# Patient Record
Sex: Female | Born: 1935
Health system: Southern US, Community
[De-identification: ages and names within clinical notes are randomized; demographics above are authoritative.]

## PROBLEM LIST (undated history)

## (undated) DIAGNOSIS — K5730 Diverticulosis of large intestine without perforation or abscess without bleeding: Secondary | ICD-10-CM

## (undated) DIAGNOSIS — M1990 Unspecified osteoarthritis, unspecified site: Secondary | ICD-10-CM

## (undated) DIAGNOSIS — I872 Venous insufficiency (chronic) (peripheral): Secondary | ICD-10-CM

## (undated) DIAGNOSIS — K222 Esophageal obstruction: Secondary | ICD-10-CM

## (undated) DIAGNOSIS — E039 Hypothyroidism, unspecified: Secondary | ICD-10-CM

## (undated) DIAGNOSIS — I10 Essential (primary) hypertension: Secondary | ICD-10-CM

## (undated) DIAGNOSIS — Z8679 Personal history of other diseases of the circulatory system: Secondary | ICD-10-CM

## (undated) DIAGNOSIS — K219 Gastro-esophageal reflux disease without esophagitis: Secondary | ICD-10-CM

## (undated) DIAGNOSIS — K449 Diaphragmatic hernia without obstruction or gangrene: Secondary | ICD-10-CM

## (undated) DIAGNOSIS — M519 Unspecified thoracic, thoracolumbar and lumbosacral intervertebral disc disorder: Secondary | ICD-10-CM

## (undated) HISTORY — DX: Diverticulosis of large intestine without perforation or abscess without bleeding: K57.30

## (undated) HISTORY — PX: APPENDECTOMY: SHX54

## (undated) HISTORY — DX: Unspecified thoracic, thoracolumbar and lumbosacral intervertebral disc disorder: M51.9

## (undated) HISTORY — DX: Personal history of other diseases of the circulatory system: Z86.79

## (undated) HISTORY — DX: Hypothyroidism, unspecified: E03.9

## (undated) HISTORY — DX: Esophageal obstruction: K22.2

## (undated) HISTORY — DX: Diaphragmatic hernia without obstruction or gangrene: K44.9

## (undated) HISTORY — DX: Gastro-esophageal reflux disease without esophagitis: K21.9

## (undated) HISTORY — PX: ABDOMINAL HYSTERECTOMY: SHX81

## (undated) HISTORY — DX: Venous insufficiency (chronic) (peripheral): I87.2

## (undated) HISTORY — DX: Unspecified osteoarthritis, unspecified site: M19.90

## (undated) HISTORY — DX: Essential (primary) hypertension: I10

---

## 2000-10-14 ENCOUNTER — Ambulatory Visit (HOSPITAL_COMMUNITY): Admission: RE | Admit: 2000-10-14 | Discharge: 2000-10-14 | Payer: Self-pay | Admitting: *Deleted

## 2000-10-14 ENCOUNTER — Encounter: Payer: Self-pay | Admitting: *Deleted

## 2002-09-05 ENCOUNTER — Encounter: Payer: Self-pay | Admitting: *Deleted

## 2002-09-05 ENCOUNTER — Ambulatory Visit (HOSPITAL_COMMUNITY): Admission: RE | Admit: 2002-09-05 | Discharge: 2002-09-05 | Payer: Self-pay | Admitting: *Deleted

## 2005-11-09 ENCOUNTER — Ambulatory Visit: Payer: Self-pay | Admitting: Gastroenterology

## 2005-11-30 ENCOUNTER — Ambulatory Visit: Payer: Self-pay | Admitting: Gastroenterology

## 2007-02-21 ENCOUNTER — Ambulatory Visit: Payer: Self-pay | Admitting: Gastroenterology

## 2007-02-23 ENCOUNTER — Ambulatory Visit (HOSPITAL_COMMUNITY): Admission: RE | Admit: 2007-02-23 | Discharge: 2007-02-23 | Payer: Self-pay | Admitting: Gastroenterology

## 2007-09-14 DIAGNOSIS — E038 Other specified hypothyroidism: Secondary | ICD-10-CM | POA: Insufficient documentation

## 2007-09-14 DIAGNOSIS — I10 Essential (primary) hypertension: Secondary | ICD-10-CM | POA: Insufficient documentation

## 2007-09-14 DIAGNOSIS — M129 Arthropathy, unspecified: Secondary | ICD-10-CM

## 2007-09-14 DIAGNOSIS — K222 Esophageal obstruction: Secondary | ICD-10-CM

## 2007-09-14 DIAGNOSIS — E039 Hypothyroidism, unspecified: Secondary | ICD-10-CM

## 2007-09-14 DIAGNOSIS — K219 Gastro-esophageal reflux disease without esophagitis: Secondary | ICD-10-CM

## 2007-09-14 DIAGNOSIS — K5730 Diverticulosis of large intestine without perforation or abscess without bleeding: Secondary | ICD-10-CM | POA: Insufficient documentation

## 2007-09-14 DIAGNOSIS — K449 Diaphragmatic hernia without obstruction or gangrene: Secondary | ICD-10-CM | POA: Insufficient documentation

## 2007-09-14 DIAGNOSIS — M5137 Other intervertebral disc degeneration, lumbosacral region: Secondary | ICD-10-CM

## 2007-09-14 HISTORY — DX: Esophageal obstruction: K22.2

## 2009-02-07 IMAGING — RF DG UGI W/ KUB
19 of 24 series · 19 of 24 positions shown · non-contrast
Comparison: none

CLINICAL DATA: 85 old female with two-year history of belching

UPPER GI SERIES W/ HIGH-DENSITY BARIUM AND KUB:
TECHNIQUE: After obtaining a scout radiograph, a double-contrast upper GI
series was performed using both high-density and thin barium.

[Series 1: run · 1 of 1 slices shown (1 of 19)]
[im 1/1]
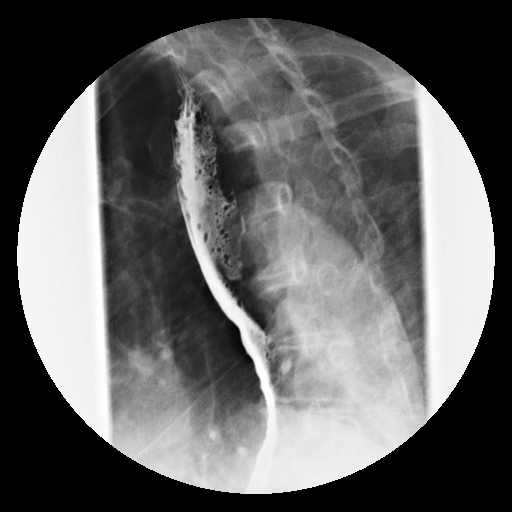

[Series 2: run · 1 of 1 slices shown (2 of 19)]
[im 1/1]
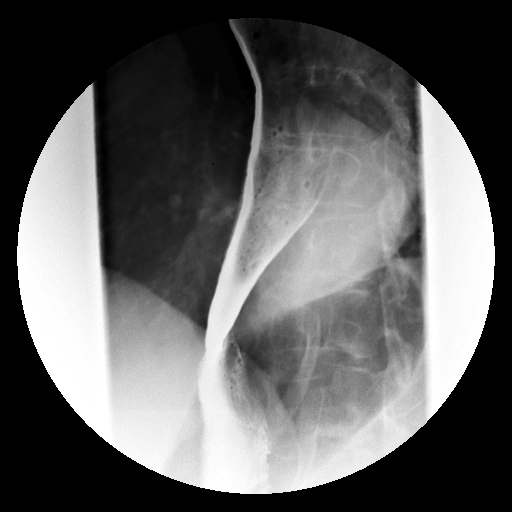

[Series 4: run · 1 of 1 slices shown (3 of 19)]
[im 1/1]
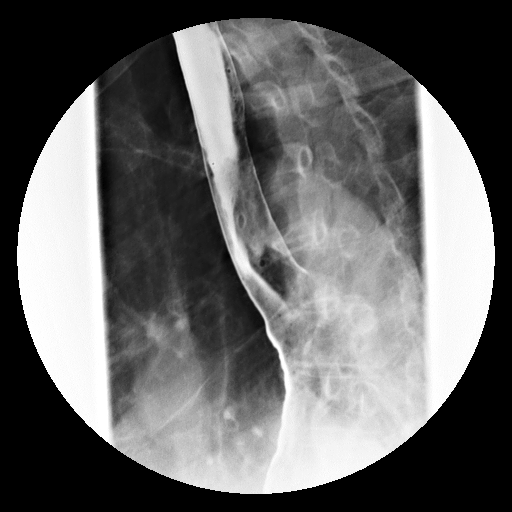

[Series 5: run · 1 of 1 slices shown (4 of 19)]
[im 1/1]
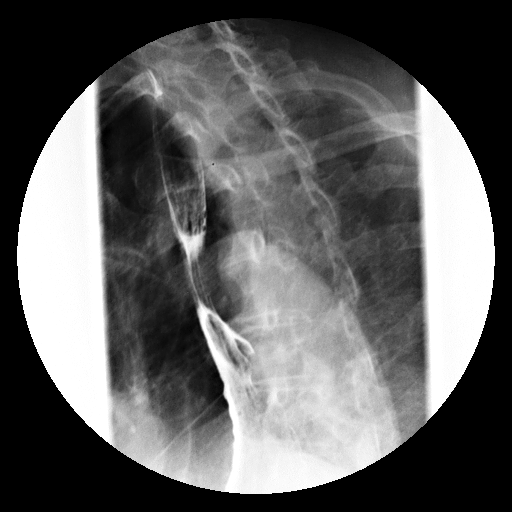

[Series 6: run · 1 of 1 slices shown (5 of 19)]
[im 1/1]
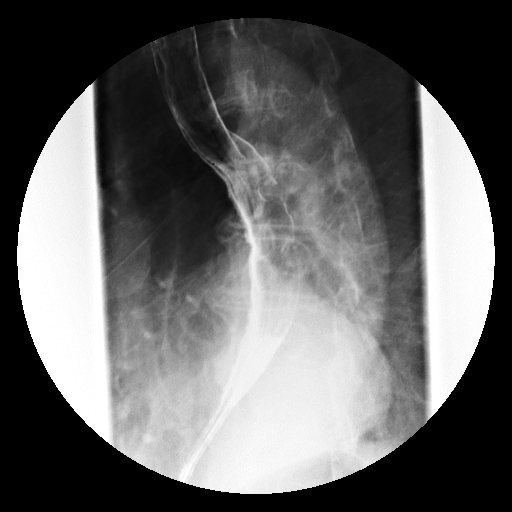

[Series 7: run · 1 of 1 slices shown (6 of 19)]
[im 1/1]
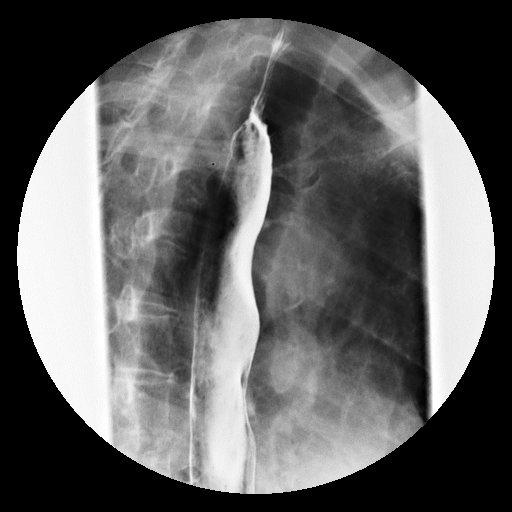

[Series 9: run · 1 of 1 slices shown (7 of 19)]
[im 1/1]
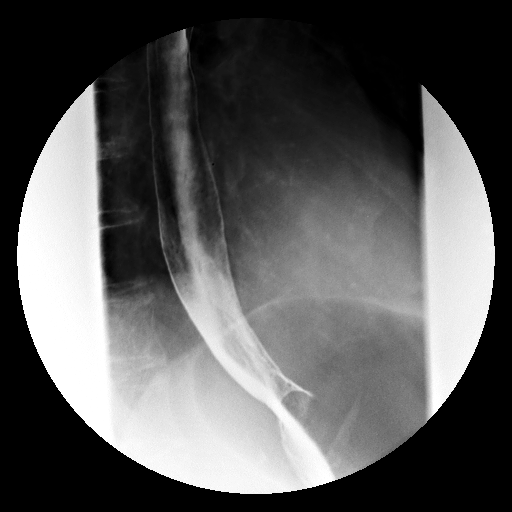

[Series 10: run · 1 of 1 slices shown (8 of 19)]
[im 1/1]
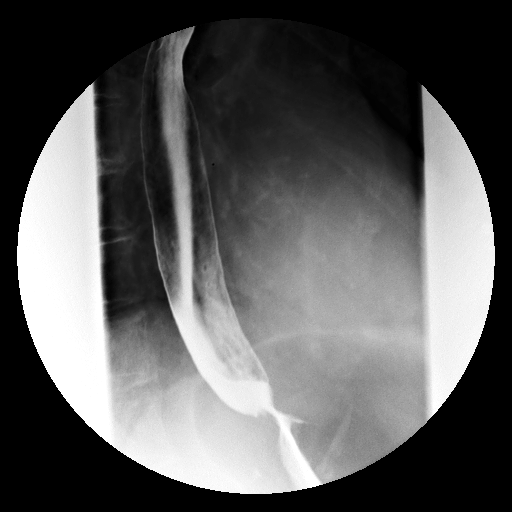

[Series 11: run · 1 of 1 slices shown (9 of 19)]
[im 1/1]
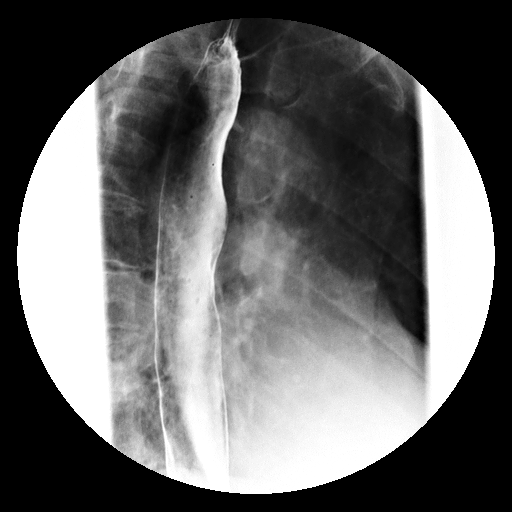

[Series 13: run · 1 of 1 slices shown (10 of 19)]
[im 1/1]
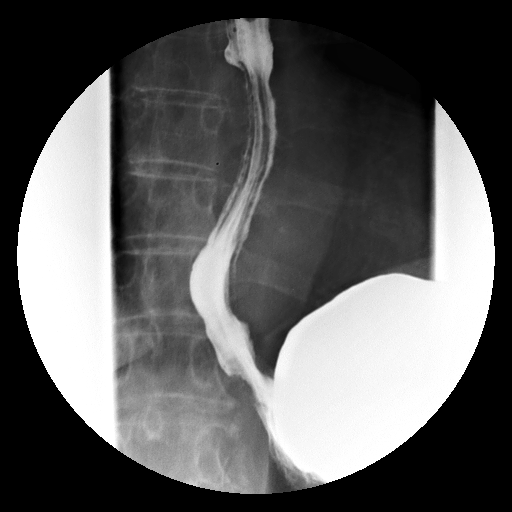

[Series 14: run · 1 of 1 slices shown (11 of 19)]
[im 1/1]
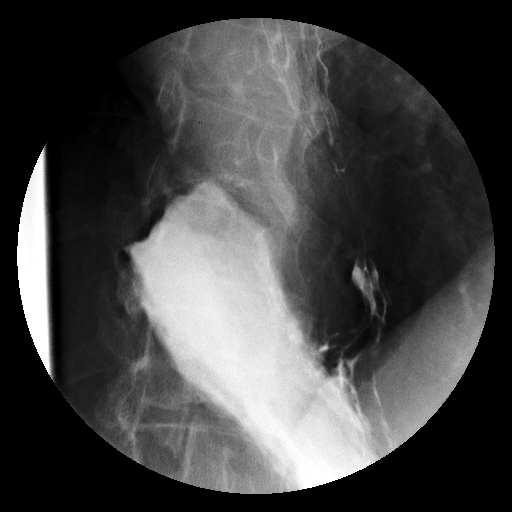

[Series 15: run · 1 of 1 slices shown (12 of 19)]
[im 1/1]
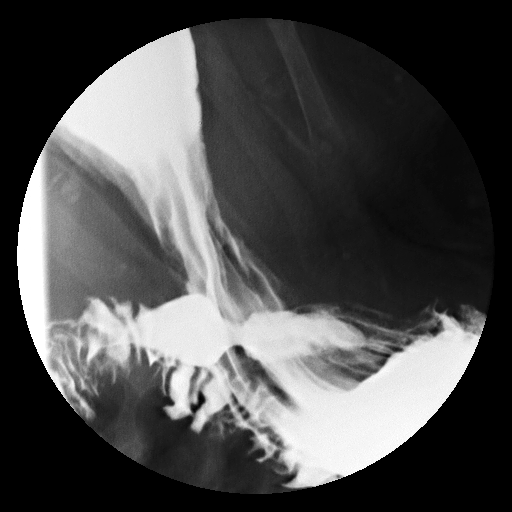

[Series 16: run · 1 of 1 slices shown (13 of 19)]
[im 1/1]
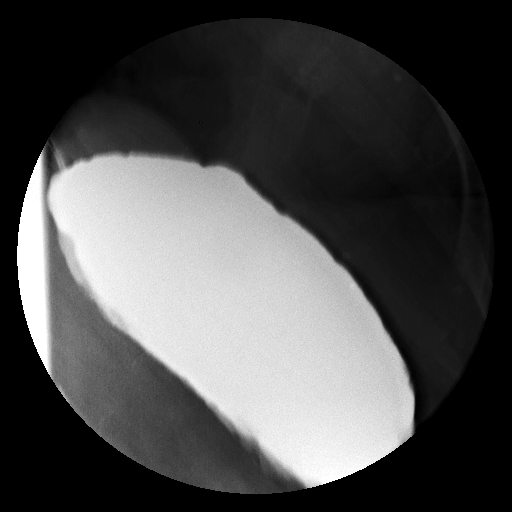

[Series 18: run · 1 of 1 slices shown (14 of 19)]
[im 1/1]
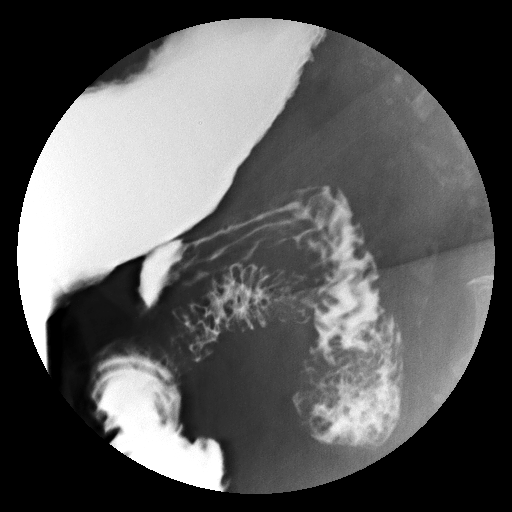

[Series 19: run · 1 of 1 slices shown (15 of 19)]
[im 1/1]
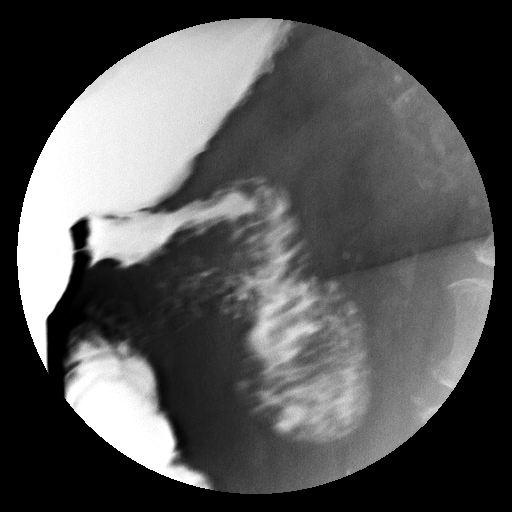

[Series 20: run · 1 of 1 slices shown (16 of 19)]
[im 1/1]
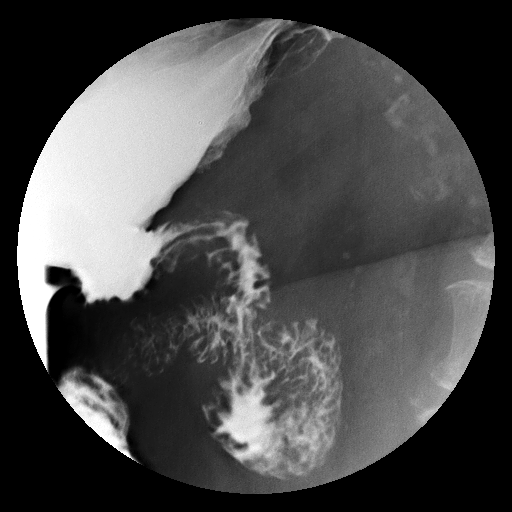

[Series 21: run · 1 of 1 slices shown (17 of 19)]
[im 1/1]
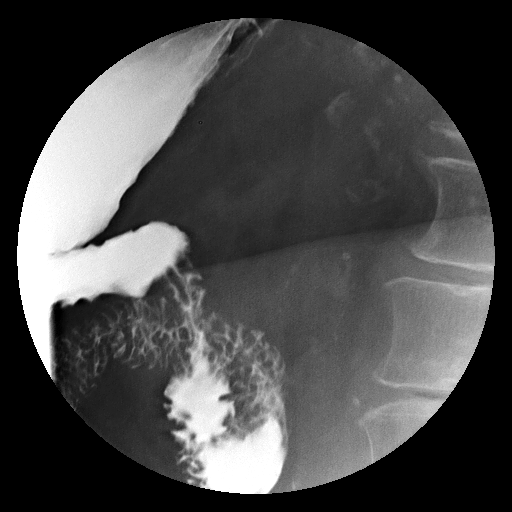

[Series 23: run · 1 of 1 slices shown (18 of 19)]
[im 1/1]
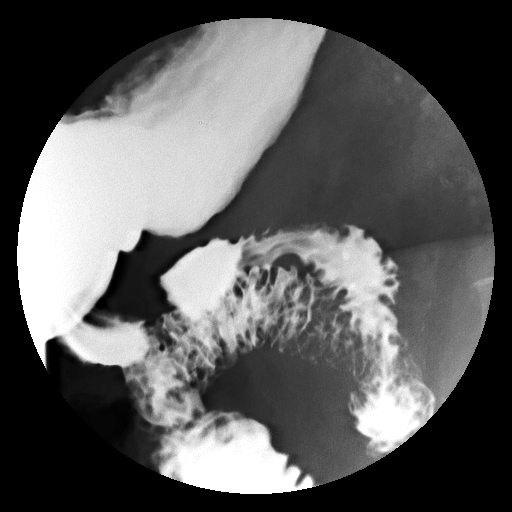

[Series 24: run · 1 of 1 slices shown (19 of 19)]
[im 1/1]
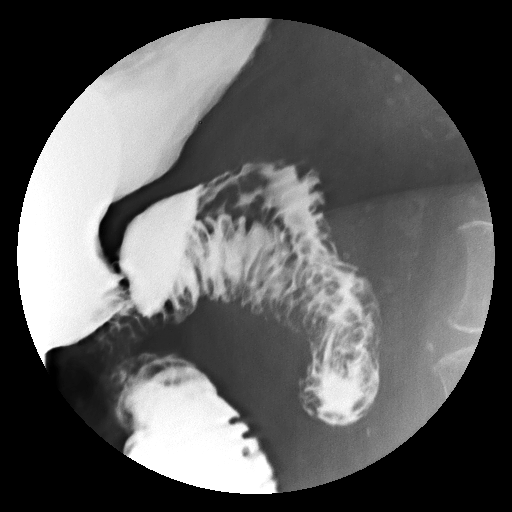

[19 of 24 positions shown; findings below may reference images not displayed]

FINDINGS: Scout film shows no evidence of bowel obstruction.
Esophagus: No evidence of a mucosal irregularity. No evidence of obstruction.
There is a Schatzki ring at the distal esophagus which does not obstruct a 13 mm
barium tablet. There was evidence of reflux during the exam as well small hiatal
hernia. Esophageal motility assessment demonstrated  mild escape of the barium
bolus from a primary stripping wave.

Stomach and duodenum: No evidence of mucosal irregularity, mass, or obstruction
in the stomach or duodenum.
IMPRESSION: 1. Schatzki ring and distal esophagus which does not obstruct a 13 mm barium
tablet.
2. Moderate gastroesophageal reflux.
3. Small hiatal hernia.
4. Mild esophageal dysmotility.
5. No evidence of abnormality in the stomach or duodenum.

## 2009-03-30 ENCOUNTER — Encounter: Payer: Self-pay | Admitting: Internal Medicine

## 2009-03-30 ENCOUNTER — Encounter: Payer: Self-pay | Admitting: Nurse Practitioner

## 2009-03-30 LAB — CONVERTED CEMR LAB
ALT: 17 units/L
AST: 22 units/L
Albumin: 3.9 g/dL
Alkaline Phosphatase: 71 units/L
BUN: 30 mg/dL
CO2: 24 meq/L
Calcium: 9.3 mg/dL
Chloride: 105 meq/L
Creatinine, Ser: 1.21 mg/dL
Glucose, Bld: 97 mg/dL
HCT: 38.7 %
Hemoglobin: 13.3 g/dL
Lymphocytes, automated: 26.3 %
MCV: 100 fL
Monocytes Relative: 10.6 %
Neutrophils Relative %: 60.6 %
Platelets: 204 10*3/uL
Potassium: 4.5 meq/L
RBC: 3.85 M/uL
RDW: 12.8 %
Sodium: 138 meq/L
Total Bilirubin: 0.2 mg/dL
Total Protein: 7.1 g/dL
WBC: 10.5 10*3/uL

## 2009-04-04 ENCOUNTER — Encounter: Payer: Self-pay | Admitting: Nurse Practitioner

## 2009-04-16 ENCOUNTER — Telehealth: Payer: Self-pay | Admitting: Gastroenterology

## 2009-04-18 ENCOUNTER — Ambulatory Visit: Payer: Self-pay | Admitting: Gastroenterology

## 2009-04-18 DIAGNOSIS — R1031 Right lower quadrant pain: Secondary | ICD-10-CM

## 2009-04-18 DIAGNOSIS — R142 Eructation: Secondary | ICD-10-CM

## 2009-04-18 DIAGNOSIS — R141 Gas pain: Secondary | ICD-10-CM | POA: Insufficient documentation

## 2009-04-18 DIAGNOSIS — R143 Flatulence: Secondary | ICD-10-CM

## 2009-05-20 ENCOUNTER — Ambulatory Visit: Payer: Self-pay | Admitting: Internal Medicine

## 2009-05-20 DIAGNOSIS — R32 Unspecified urinary incontinence: Secondary | ICD-10-CM

## 2009-05-20 DIAGNOSIS — D649 Anemia, unspecified: Secondary | ICD-10-CM | POA: Insufficient documentation

## 2010-06-26 ENCOUNTER — Encounter: Payer: Self-pay | Admitting: Internal Medicine

## 2010-06-26 ENCOUNTER — Encounter: Payer: Self-pay | Admitting: Endocrinology

## 2010-07-23 NOTE — Letter (Signed)
Summary: [facility]  [facility]   Imported By: Lennie Odor 07/08/2010 14:18:53  _____________________________________________________________________  External Attachment:    Type:   Image     Comment:   External Document

## 2010-07-23 NOTE — Letter (Signed)
Summary: [facility]   Imported By: Sherian Rein 07/08/2010 11:07:41  _____________________________________________________________________  External Attachment:    Type:   Image     Comment:   External Document

## 2010-11-03 NOTE — Assessment & Plan Note (Signed)
[facility]                         GASTROENTEROLOGY OFFICE NOTE   NAME:Phillips, Chelsea BOLTE                  MRN:          [id number]  DATE:02/21/2007                            DOB:          [birth year]    Chelsea Phillips no longer is having right lower quadrant pain as per  clinic visit in May of 2007 which led to endoscopy and colonoscopy.  She  now says that ever since she had endoscopy on November 30, 2005 that she has  had constant belching and burping, not relieved by antacids or Nexium.  At the time of her endoscopy she had a 5 cm. hiatal hernia and underwent  esophageal dilation.  She still complains of intermittent solid food  dysphagia.  She denies abdominal pain, excessive gas production,  bloating or distention.   She is on Benefiber, Synthroid and Benicar.   She has had no anorexia, weight loss, other systemic complaints.  She  previously saw Dr. Charm Barges and was not happy with his care additionally.   ADDITIONAL MEDICAL PROBLEMS:  In this patient insomnia, hypothyroidism  and chronic fatigue.   She weighs 172 pounds, blood pressure is 118/70 and pulse was 80 and  regular.  I could not appreciate stigmata of chronic liver disease.  CHEST:  Clear.  She was in a regular rhythm without significant murmurs, gallops or  rubs.  ABDOMEN:  Did show small umbilical hernia but otherwise was unremarkable  without distention, organomegaly, masses or tenderness.  Bowel sounds  were normal and there was no succussion splash noted in the epigastric  area.   ASSESSMENT:  I think Chelsea Phillips has functional gastrointestinal  problems and I doubt she has any serious underlying gastrointestinal  pathology.  Most of her belching and burping is obviously secondary to  airophasia.   RECOMMENDATIONS:  1. Checked barium swallow and upper GI series to be sure there is no      associated upper GI motility disorder.  2. Patient information concerning  airophasia, belching and excessive      gas.  3. Continue other medications per Dr. Jeanie Sewer.   Patient did have CT scan of the abdomen and pelvis without contrast in  May of 2007 which was unremarkable except for diverticulosis.     Chelsea Rea. Jarold Motto, MD, Caleen Essex, FAGA  Electronically Signed    DRP/MedQ  DD: 02/21/2007  DT: 02/21/2007  Job #: [id number]   cc:   Myra Rude, MD

## 2010-11-06 NOTE — Op Note (Signed)
   NAME:  Chelsea Phillips, Chelsea Phillips                     ACCOUNT NO.:  [id number]   MEDICAL RECORD NO.:  [id number]                   PATIENT TYPE:  OUT   LOCATION:  XRAY                                 FACILITY:  [facility]   PHYSICIAN:  Ricky D. Gasper Sells, M.D.             DATE OF BIRTH:  06/25/1921   DATE OF PROCEDURE:  09/05/2002  DATE OF DISCHARGE:                                 OPERATIVE REPORT   PROCEDURE:  Left SI joint block using 22-gauge needle under fluoroscopic  control.   SURGEON:  Ricky D. Gasper Sells, M.D.   COMPLICATIONS:  Nil.   SUMMARY:  This is a 77 year old female with underlying spinal stenosis and  bilateral SI joint dysfunction.  Two years ago she was injected and she had  significant reduction in her pain and actually was doing quite well until a  few weeks ago when she was cleaning her yard up and bent over to pick some  stuff up and basically stirred up her underlying SI joint dysfunction.  Clinically, she had the typical picture with tenderness over the joints,  negative straight leg raises, negative hip examination, and a positive SI  joint provocative test.   She was brought to the x-ray department where buttocks were prepped with  Betadine solution and the 22-gauge needle was inserted down to the junction  of the left SI joint with the pelvis in the AP projection.  Poking this  joint recreated her discomfort.   The joint was then blocked with 2 mL of 0.5% bupivacaine without  preservatives or epinephrine, and 1 mL of Depo- Medrol, 80 mg of  methylprednisolone.  Was locked in 0.5 mL aliquots with aspiration before  each injection.   This significantly decreased her pain.  Control x-rays were done and she  left the x-ray department in good condition.                                               Ricky D. Gasper Sells, M.D.    RDH/MEDQ  D:  09/06/2002  T:  09/06/2002  Job:  [id number]

## 2010-11-06 NOTE — Op Note (Signed)
   NAME:  Chelsea Phillips, Chelsea Phillips                     ACCOUNT NO.:  [id number]   MEDICAL RECORD NO.:  [id number]                   PATIENT TYPE:  OUT   LOCATION:  XRAY                                 FACILITY:  [facility]   PHYSICIAN:  Ricky D. Gasper Sells, M.D.             DATE OF BIRTH:  1921-09-07   DATE OF PROCEDURE:  09/05/2002  DATE OF DISCHARGE:                                 OPERATIVE REPORT   PROCEDURE:  Right SI joint block using 22-gauge needle under fluoroscopic  control, 2 mL of 0.5% bupivacaine and 1 mL of Depo-Medrol, 80 mg of  methylprednisolone.   SURGEON:  Ricky D. Gasper Sells, M.D.   COMPLICATIONS:  Nil.   SUMMARY:  This is a 77 year old female with injection responsive SI joint  dysfunction in addition to her underlying spinal stenosis.  I injected her a  couple years ago with a good response.  This held up until last week when  she injured her back cleaning her yard up.  She had clinically typical SI  joint dysfunction so she was taken to the x-ray department where her  __________ was prepped with Betadine solution and a 22-gauge needle was  inserted down to the junction of the SI joint with the pelvis.  Poking this  joint on the right side recreated her discomfort.  The joint was then  blocked with 2 mL of 0.5% bupivacaine and 1 mL of Depo-Medrol in 0.5 mL  aliquots for __________ injection.  This significantly decreased her right-  sided discomfort.                                               Ricky D. Gasper Sells, M.D.    RDH/MEDQ  D:  09/06/2002  T:  09/06/2002  Job:  [id number]

## 2011-06-30 ENCOUNTER — Other Ambulatory Visit: Payer: Self-pay | Admitting: Gastroenterology

## 2011-06-30 NOTE — Telephone Encounter (Signed)
Patient has not been taking the Nexium and has not been seen in office since 2010, she will need ov before she can have refill, daughter says ok.

## 2011-08-09 ENCOUNTER — Encounter: Payer: Self-pay | Admitting: *Deleted

## 2011-08-12 ENCOUNTER — Encounter: Payer: Self-pay | Admitting: Gastroenterology

## 2011-08-12 ENCOUNTER — Other Ambulatory Visit (INDEPENDENT_AMBULATORY_CARE_PROVIDER_SITE_OTHER): Payer: Medicare Other

## 2011-08-12 ENCOUNTER — Ambulatory Visit (INDEPENDENT_AMBULATORY_CARE_PROVIDER_SITE_OTHER): Payer: Medicare Other | Admitting: Gastroenterology

## 2011-08-12 DIAGNOSIS — E539 Vitamin B deficiency, unspecified: Secondary | ICD-10-CM

## 2011-08-12 DIAGNOSIS — E538 Deficiency of other specified B group vitamins: Secondary | ICD-10-CM

## 2011-08-12 DIAGNOSIS — K5730 Diverticulosis of large intestine without perforation or abscess without bleeding: Secondary | ICD-10-CM

## 2011-08-12 DIAGNOSIS — R209 Unspecified disturbances of skin sensation: Secondary | ICD-10-CM

## 2011-08-12 DIAGNOSIS — K219 Gastro-esophageal reflux disease without esophagitis: Secondary | ICD-10-CM

## 2011-08-12 DIAGNOSIS — E039 Hypothyroidism, unspecified: Secondary | ICD-10-CM | POA: Insufficient documentation

## 2011-08-12 DIAGNOSIS — Z79899 Other long term (current) drug therapy: Secondary | ICD-10-CM

## 2011-08-12 LAB — TSH: TSH: 3.26 u[IU]/mL (ref 0.35–5.50)

## 2011-08-12 LAB — BASIC METABOLIC PANEL
BUN: 22 mg/dL (ref 6–23)
Calcium: 9.2 mg/dL (ref 8.4–10.5)
Chloride: 104 mEq/L (ref 96–112)
Creatinine, Ser: 1.2 mg/dL (ref 0.4–1.2)
GFR: 46.4 mL/min — ABNORMAL LOW (ref >60.00)

## 2011-08-12 LAB — CBC WITH DIFFERENTIAL/PLATELET
Eosinophils Relative: 2.4 % (ref 0.0–5.0)
HCT: 42 % (ref 36.0–46.0)
Hemoglobin: 14.1 g/dL (ref 12.0–15.0)
Lymphs Abs: 3.1 10*3/uL (ref 0.7–4.0)
MCV: 97.5 fl (ref 78.0–100.0)
Monocytes Absolute: 0.9 10*3/uL (ref 0.1–1.0)
Monocytes Relative: 10.2 % (ref 3.0–12.0)
Neutro Abs: 4.5 10*3/uL (ref 1.4–7.7)
WBC: 8.7 10*3/uL (ref 4.5–10.5)

## 2011-08-12 LAB — HEPATIC FUNCTION PANEL
Albumin: 4.2 g/dL (ref 3.5–5.2)
Alkaline Phosphatase: 59 U/L (ref 39–117)
Total Bilirubin: 0.5 mg/dL (ref 0.3–1.2)

## 2011-08-12 LAB — HIGH SENSITIVITY CRP: CRP, High Sensitivity: 5.21 mg/L — ABNORMAL HIGH (ref 0.000–5.000)

## 2011-08-12 LAB — MAGNESIUM: Magnesium: 2.1 mg/dL (ref 1.5–2.5)

## 2011-08-12 LAB — IBC PANEL
Iron: 93 ug/dL (ref 42–145)
Saturation Ratios: 28.8 % (ref 20.0–50.0)
Transferrin: 230.4 mg/dL (ref 212.0–360.0)

## 2011-08-12 MED ORDER — PEG-KCL-NACL-NASULF-NA ASC-C 100 G PO SOLR: 1.0000 | Freq: Once | ORAL | Status: DC

## 2011-08-12 MED ORDER — LIDOCAINE 5 % EX PTCH: 1.0000 | MEDICATED_PATCH | CUTANEOUS | Status: AC

## 2011-08-12 MED ORDER — ESOMEPRAZOLE MAGNESIUM 40 MG PO CPDR: 40.0000 mg | DELAYED_RELEASE_CAPSULE | Freq: Every day | ORAL | Status: DC

## 2011-08-12 NOTE — Patient Instructions (Signed)
Your procedure has been scheduled for 08/16/2011, please follow the seperate instructions.  Your prescription(s) have been sent to you pharmacy.  Please go to the basement today for your labs.  Apply the Lidoderm patch to your foot, make sure to change patch every 12 hours and use only as needed.

## 2011-08-12 NOTE — Progress Notes (Signed)
History of Present Illness:  This is a delightful 78 year old Caucasian female with chronic GERD who ran out of her Nexium,and has had recurrent regurgitation, burning substernal chest pain, and intermittent solid food dysphagia aggressively over the last 3-4 months.. Last endoscopy and dilation was several years ago. There is no history of Raynaud's phenomenon, but the patient does have severe burning of her feet keeping her up at night. Her only medication is Synthroid 50 mcg a day. She has lumbar disc disease and chronic low back pain, but has not all analgesics. Review of her chart shows chronic right lower quadrant pain for many years with last colonoscopy performed in 2007. She has mild constipation but denies melena or hematochezia, anorexia, weight loss, systemic or hepatobiliary complaints. She does have known rather severe diverticulosis. The patient has not had an episode of diverticulitis in several years. She recently had cardiac evaluation by Dr. Susa Griffins and was apparently told that she had" reflux". Patient follows a regular diet and denies any specific food intolerances. She gives no history of anemia or other metabolic abnormalities but apparently has not had labs in several years.. Primary care physician is Dr. Feliciana Rossetti. Her daughter was present throughout the interview and exam.  I have reviewed this patient's present history, medical and surgical past history, allergies and medications.     ROS: The remainder of the 10 point ROS is negative  Allergies  Allergen Reactions  . Hydrocodone Rash   No outpatient prescriptions prior to visit.   Past Medical History  Diagnosis Date  . Hiatal hernia   . Esophageal reflux   . Stricture and stenosis of esophagus   . Diverticulosis of colon (without mention of hemorrhage)   . Hypertension   . Arthritis   . Hypothyroidism   . Lumbar disc disease    Past Surgical History  Procedure Date  . Abdominal hysterectomy   .  Appendectomy    History   Social History  . Marital Status: Widowed    Spouse Name: N/A    Number of Children: 3  . Years of Education: N/A   Occupational History  . Retired    Social History Main Topics  . Smoking status: Never Smoker   . Smokeless tobacco: Never Used  . Alcohol Use: No  . Drug Use: No  . Sexually Active: None   Other Topics Concern  . None   Social History Narrative  . None   Family History  Problem Relation Age of Onset  . Heart disease Mother         Physical Exam: General well developed well nourished patient in no acute distress, appearing her stated age Eyes PERRLA, no icterus, fundoscopic exam per opthamologist Skin no lesions noted Neck supple, no adenopathy, no thyroid enlargement, no tenderness Chest clear to percussion and auscultation Heart no significant murmurs, gallops or rubs noted Abdomen no hepatosplenomegaly masses or tenderness, BS normal. Umbilical hernia noted which is easily reduced.  Extremities no acute joint lesions, edema, phlebitis or evidence of cellulitis. Peripheral pulses appear intact. Neurologic patient oriented x 3, cranial nerves intact, no focal neurologic deficits noted. Psychological mental status normal and normal affect.  Assessment and plan: Chronic acid reflux with probable recurrent peptic stricture of her esophagus area and I've scheduled for endoscopy and probable Maloney dilatation. We have renewed her Nexium 40 mg 30 minutes before breakfast. Followup colonoscopy also has been scheduled because of her chronic abdominal pain change in bowel habits. Because of her age  we'll perform these procedures with propofol sedation a nurse anesthesia attendence. I have suggested a high fiber diet with daily Metamucil and liberal by mouth fluids. She seemed to have a peripheral neuropathy in her feet of unexplained etiology, and various labs have been ordered including B12 level to see if there is some metabolic cause  for this symptom. Encounter Diagnoses  Name Primary?  . Esophageal reflux   . Diverticulosis of colon (without mention of hemorrhage)

## 2011-08-13 ENCOUNTER — Telehealth: Payer: Self-pay | Admitting: Gastroenterology

## 2011-08-13 LAB — CELIAC PANEL 10: Tissue Transglut Ab: 11.1 U/mL (ref <20)

## 2011-08-13 NOTE — Telephone Encounter (Signed)
Spoke with pt to explain Dr Jarold Motto suggests she at least needs EGD with dilation. Pt states she cannot tolerate the colon prep and she doesn't think she needs the EGD either. Explained she may have to pay a cancellation and she stated understanding. Charge fee? Thanks.

## 2011-08-13 NOTE — Telephone Encounter (Signed)
She needs endoscopy and dilation. We can probably forego colonoscopy.

## 2011-08-13 NOTE — Telephone Encounter (Signed)
No charge per Dr Jarold Motto. Thanks [place]!

## 2011-08-13 NOTE — Telephone Encounter (Signed)
no

## 2011-08-16 ENCOUNTER — Encounter: Payer: Medicare Other | Admitting: Gastroenterology

## 2012-06-06 ENCOUNTER — Other Ambulatory Visit (HOSPITAL_COMMUNITY): Payer: Self-pay | Admitting: Cardiovascular Disease

## 2012-06-06 DIAGNOSIS — I495 Sick sinus syndrome: Secondary | ICD-10-CM

## 2012-06-06 DIAGNOSIS — I480 Paroxysmal atrial fibrillation: Secondary | ICD-10-CM

## 2012-06-19 ENCOUNTER — Ambulatory Visit (HOSPITAL_COMMUNITY)
Admission: RE | Admit: 2012-06-19 | Discharge: 2012-06-19 | Disposition: A | Discharge status: Home / Self Care | Payer: Medicare Other | Source: Ambulatory Visit | Attending: Cardiovascular Disease | Admitting: Cardiovascular Disease

## 2012-06-19 DIAGNOSIS — I480 Paroxysmal atrial fibrillation: Secondary | ICD-10-CM

## 2012-06-19 DIAGNOSIS — I495 Sick sinus syndrome: Secondary | ICD-10-CM

## 2012-06-19 DIAGNOSIS — I4891 Unspecified atrial fibrillation: Secondary | ICD-10-CM | POA: Insufficient documentation

## 2012-06-19 DIAGNOSIS — I080 Rheumatic disorders of both mitral and aortic valves: Secondary | ICD-10-CM | POA: Insufficient documentation

## 2012-06-19 HISTORY — PX: TRANSTHORACIC ECHOCARDIOGRAM: SHX275

## 2012-06-19 NOTE — Progress Notes (Signed)
2D Echo Performed 06/19/2012    Laurieanne Galloway, RCS  

## 2012-10-06 ENCOUNTER — Encounter: Payer: Self-pay | Admitting: Pharmacist Clinician (PhC)/ Clinical Pharmacy Specialist

## 2012-10-06 DIAGNOSIS — Z7901 Long term (current) use of anticoagulants: Secondary | ICD-10-CM

## 2012-10-06 DIAGNOSIS — I4891 Unspecified atrial fibrillation: Secondary | ICD-10-CM | POA: Insufficient documentation

## 2012-11-20 LAB — PROTIME-INR: INR: 3.4 — AB (ref <=1.1)

## 2012-11-21 ENCOUNTER — Ambulatory Visit (INDEPENDENT_AMBULATORY_CARE_PROVIDER_SITE_OTHER): Payer: Self-pay | Admitting: Pharmacist Clinician (PhC)/ Clinical Pharmacy Specialist

## 2012-11-21 DIAGNOSIS — I4891 Unspecified atrial fibrillation: Secondary | ICD-10-CM

## 2012-11-21 DIAGNOSIS — Z7901 Long term (current) use of anticoagulants: Secondary | ICD-10-CM

## 2012-12-12 LAB — PROTIME-INR: INR: 2.7 — AB (ref <=1.1)

## 2012-12-13 ENCOUNTER — Ambulatory Visit (INDEPENDENT_AMBULATORY_CARE_PROVIDER_SITE_OTHER): Payer: Medicare Other | Admitting: Pharmacist Clinician (PhC)/ Clinical Pharmacy Specialist

## 2012-12-13 DIAGNOSIS — Z7901 Long term (current) use of anticoagulants: Secondary | ICD-10-CM

## 2012-12-13 DIAGNOSIS — I4891 Unspecified atrial fibrillation: Secondary | ICD-10-CM

## 2013-01-11 ENCOUNTER — Ambulatory Visit (INDEPENDENT_AMBULATORY_CARE_PROVIDER_SITE_OTHER): Payer: Self-pay | Admitting: Pharmacist Clinician (PhC)/ Clinical Pharmacy Specialist

## 2013-01-11 DIAGNOSIS — Z7901 Long term (current) use of anticoagulants: Secondary | ICD-10-CM

## 2013-01-11 DIAGNOSIS — I4891 Unspecified atrial fibrillation: Secondary | ICD-10-CM

## 2013-01-31 ENCOUNTER — Ambulatory Visit: Payer: Self-pay | Admitting: Pharmacist Clinician (PhC)/ Clinical Pharmacy Specialist

## 2013-01-31 DIAGNOSIS — I4891 Unspecified atrial fibrillation: Secondary | ICD-10-CM

## 2013-01-31 DIAGNOSIS — Z7901 Long term (current) use of anticoagulants: Secondary | ICD-10-CM

## 2013-02-09 ENCOUNTER — Ambulatory Visit (INDEPENDENT_AMBULATORY_CARE_PROVIDER_SITE_OTHER): Payer: Self-pay | Admitting: Pharmacist Clinician (PhC)/ Clinical Pharmacy Specialist

## 2013-02-09 DIAGNOSIS — Z7901 Long term (current) use of anticoagulants: Secondary | ICD-10-CM

## 2013-02-09 DIAGNOSIS — I4891 Unspecified atrial fibrillation: Secondary | ICD-10-CM

## 2013-02-26 ENCOUNTER — Ambulatory Visit (INDEPENDENT_AMBULATORY_CARE_PROVIDER_SITE_OTHER): Payer: Self-pay | Admitting: Pharmacist Clinician (PhC)/ Clinical Pharmacy Specialist

## 2013-02-26 DIAGNOSIS — I4891 Unspecified atrial fibrillation: Secondary | ICD-10-CM

## 2013-02-26 DIAGNOSIS — Z7901 Long term (current) use of anticoagulants: Secondary | ICD-10-CM

## 2013-03-07 ENCOUNTER — Telehealth: Payer: Self-pay | Admitting: Cardiovascular Disease

## 2013-03-07 NOTE — Telephone Encounter (Signed)
Chart on Dr. Kandis Cocking cart w/ note.

## 2013-03-07 NOTE — Telephone Encounter (Signed)
Would like Dr Alanda Amass recommend her a new doctor since he is retiring please.

## 2013-03-09 ENCOUNTER — Telehealth: Payer: Self-pay | Admitting: Cardiovascular Disease

## 2013-03-09 MED ORDER — WARFARIN SODIUM 4 MG PO TABS: ORAL_TABLET | ORAL | Status: DC

## 2013-03-09 NOTE — Telephone Encounter (Signed)
Need refill on her Warfarin 4 mg #90.

## 2013-03-09 NOTE — Telephone Encounter (Signed)
Message forwarded to K. Alvstad, PharmD.  

## 2013-03-16 ENCOUNTER — Ambulatory Visit (INDEPENDENT_AMBULATORY_CARE_PROVIDER_SITE_OTHER): Payer: Medicare Other | Admitting: Pharmacist Clinician (PhC)/ Clinical Pharmacy Specialist

## 2013-03-16 DIAGNOSIS — I4891 Unspecified atrial fibrillation: Secondary | ICD-10-CM

## 2013-03-16 DIAGNOSIS — Z7901 Long term (current) use of anticoagulants: Secondary | ICD-10-CM

## 2013-03-27 ENCOUNTER — Telehealth: Payer: Self-pay | Admitting: Pharmacist Clinician (PhC)/ Clinical Pharmacy Specialist

## 2013-03-27 NOTE — Telephone Encounter (Signed)
Pt to have dental extraction Tuesday.  Per Dr. Alanda Amass note, he hopes to get her off warfarin soon, will have hin review if ok to d/c.  In the meantime, I advised that she hold her dose x 2 days prior to dental extraction.  Pt voiced understanding.

## 2013-03-27 NOTE — Telephone Encounter (Signed)
Pt to have tooth extracted on Tuesday.  How long does she need to be off Warfarin?

## 2013-04-03 ENCOUNTER — Encounter: Payer: Self-pay | Admitting: Internal Medicine

## 2013-04-03 ENCOUNTER — Encounter: Payer: Self-pay | Admitting: *Deleted

## 2013-04-04 ENCOUNTER — Ambulatory Visit (INDEPENDENT_AMBULATORY_CARE_PROVIDER_SITE_OTHER): Payer: Medicare Other | Admitting: Internal Medicine

## 2013-04-04 ENCOUNTER — Encounter: Payer: Self-pay | Admitting: Internal Medicine

## 2013-04-04 VITALS — BP 128/80 | HR 63 | Ht 61.0 in | Wt 178.9 lb

## 2013-04-04 DIAGNOSIS — I4891 Unspecified atrial fibrillation: Secondary | ICD-10-CM

## 2013-04-04 DIAGNOSIS — I10 Essential (primary) hypertension: Secondary | ICD-10-CM

## 2013-04-04 DIAGNOSIS — Z7901 Long term (current) use of anticoagulants: Secondary | ICD-10-CM

## 2013-04-04 NOTE — Progress Notes (Signed)
OFFICE NOTE  Chief Complaint:  Routine follow-up  Primary Care Physician: Blane Ohara, MD  HPI:  Chelsea Phillips is a pleasant 79 year old female who still lives independently. She's been followed in the past by Dr. Alanda Amass prior to his retirement for atrial fibrillation. Her first episode of atrial fibrillation was 7 years ago and she had very little A. fib for which he was symptomatic until she was hospitalized in July of 2013 with A. fib and RVR. She was placed on Cardizem and Lopressor and her thyroid medicine was increased and she was noted to be hypothyroid. She spontaneously converted back to sinus and has been on warfarin. Recently she saw Dr. Alanda Amass and there was some question about possibly coming off of warfarin or switching her to an alternative NOAC.  She wore a monitor in April of this year and did have some atrial fibrillation detected. She denies any chest pain or worsening shortness of breath. She does have a history of some mild hypertension currently only on losartan and metoprolol.   PMHx:  Past Medical History  Diagnosis Date  . Hiatal hernia   . Esophageal reflux   . Stricture and stenosis of esophagus   . Diverticulosis of colon (without mention of hemorrhage)   . Hypertension   . Arthritis   . Hypothyroidism   . Lumbar disc disease   . Venous insufficiency   . History of atrial fibrillation     with RVR (hopsitalized 12/2011) - converted spontanesouly    Past Surgical History  Procedure Laterality Date  . Abdominal hysterectomy    . Appendectomy    . Transthoracic echocardiogram  06/19/2012    EF 55-60%, grade 1 diastolic dysfunction; mild AV regurg; mild MR, calcified MV annulus; mild AI; normal PA pressure     FAMHx:  Family History  Problem Relation Age of Onset  . Heart disease Mother   . Leukemia Sister     SOCHx:   reports that she has never smoked. She has never used smokeless tobacco. She reports that she does not drink alcohol  or use illicit drugs.  ALLERGIES:  No Known Allergies  ROS: A comprehensive review of systems was negative except for: Respiratory: positive for dyspnea on exertion Musculoskeletal: positive for left ankle fracture.eversion  HOME MEDS: Current Outpatient Prescriptions  Medication Sig Dispense Refill  . esomeprazole (NEXIUM) 40 MG capsule Take 40 mg by mouth daily before breakfast.      . HYDROcodone-acetaminophen (NORCO/VICODIN) 5-325 MG per tablet Take 1 tablet by mouth every 6 (six) hours as needed for pain.      Marland Kitchen levothyroxine (SYNTHROID, LEVOTHROID) 75 MCG tablet Take 75 mcg by mouth daily before breakfast.      . losartan (COZAAR) 50 MG tablet Take 50 mg by mouth daily.      . metoprolol tartrate (LOPRESSOR) 25 MG tablet Take 25 mg by mouth 2 (two) times daily.      . mometasone (NASONEX) 50 MCG/ACT nasal spray Place 2 sprays into the nose daily.      Marland Kitchen warfarin (COUMADIN) 4 MG tablet Take 1 tablet by mouth once daily or as directed  90 tablet  1   No current facility-administered medications for this visit.    LABS/IMAGING: No results found for this or any previous visit (from the past 48 hour(s)). No results found.  VITALS: BP 128/80  Pulse 63  Ht 5\' 1"  (1.549 m)  Wt 178 lb 14.4 oz (81.149 kg)  BMI 33.82 kg/m2  EXAM: General appearance: alert and no distress Lungs: clear to auscultation bilaterally Heart: regular rate and rhythm, S1, S2 normal, no murmur, click, rub or gallop Abdomen: soft, non-tender; bowel sounds normal; no masses,  no organomegaly and obese Pulses: 2+ and symmetric  EKG: Normal sinus rhythm at 63  ASSESSMENT: 1. Paroxysmal atrial fibrillation 2. Hypertension-controlled 3. Chronically anticoagulated on warfarin  PLAN: 1.   Chelsea Phillips is interested in either coming off of warfarin or switching to a NOAC. I'm hesitant to take her off of warfarin based on her CHADS2VASC score of 4 and CHADS2 score of 2 - this puts her risks of stroke at  5-6% annually without therapy. She has not had any bleeding complications or significant falls are concerning for continuing to use anticoagulation. She will wish to consider whether she wants to stay on warfarin or change to possibly Eliquis.  I will discuss this further with our pharmacist to see if she has an opinion on the subject. Plan to see her back in 6 months or sooner if necessary.  Chrystie Nose, MD, [facility] Attending Cardiologist [facility]  Cloteal Isaacson C 04/04/2013, 12:33 PM

## 2013-04-04 NOTE — Patient Instructions (Signed)
Your physician wants you to follow-up in:  6 months. You will receive a reminder letter in the mail two months in advance. If you don't receive a letter, please call our office to schedule the follow-up appointment.   

## 2013-04-05 ENCOUNTER — Encounter: Payer: Self-pay | Admitting: Internal Medicine

## 2013-04-12 ENCOUNTER — Ambulatory Visit: Payer: Self-pay | Admitting: Pharmacist Clinician (PhC)/ Clinical Pharmacy Specialist

## 2013-04-12 DIAGNOSIS — I4891 Unspecified atrial fibrillation: Secondary | ICD-10-CM

## 2013-04-12 DIAGNOSIS — Z7901 Long term (current) use of anticoagulants: Secondary | ICD-10-CM

## 2013-04-12 MED ORDER — APIXABAN 5 MG PO TABS: 5.0000 mg | ORAL_TABLET | Freq: Two times a day (BID) | ORAL | Status: DC

## 2013-04-26 ENCOUNTER — Telehealth: Payer: Self-pay

## 2013-04-26 NOTE — Telephone Encounter (Signed)
Please advise for Levothryoxine - Dr Alanda Amass prescribed this in the past.

## 2013-04-27 NOTE — Telephone Encounter (Signed)
Rx refused, defer to PCP. Pharmacy notified electronically.

## 2013-04-27 NOTE — Telephone Encounter (Signed)
Defer to PCP to order.  Dr. Rennis Golden

## 2013-05-08 ENCOUNTER — Telehealth: Payer: Self-pay | Admitting: Pharmacist Clinician (PhC)/ Clinical Pharmacy Specialist

## 2013-05-08 NOTE — Telephone Encounter (Signed)
Pt went to lab on 11/17 and had INR drawn.  Warfarin was switched to Eliquis 5mg  bid several weeks ago.  Explained to pt that she need not get INR draws in the future, but did ask that she get BMET that was ordered at October MD visit.  Will print and mail copy of lab order to pt today.  Pt voiced understanding

## 2013-08-01 ENCOUNTER — Other Ambulatory Visit: Payer: Self-pay | Admitting: Internal Medicine

## 2013-08-01 NOTE — Telephone Encounter (Signed)
Rx was sent to pharmacy electronically. 

## 2013-08-09 ENCOUNTER — Telehealth: Payer: Self-pay | Admitting: Internal Medicine

## 2013-08-09 ENCOUNTER — Other Ambulatory Visit: Payer: Self-pay | Admitting: Pharmacist Clinician (PhC)/ Clinical Pharmacy Specialist

## 2013-08-09 MED ORDER — APIXABAN 5 MG PO TABS: 5.0000 mg | ORAL_TABLET | Freq: Two times a day (BID) | ORAL | Status: DC

## 2013-08-09 NOTE — Telephone Encounter (Signed)
Rx was sent to pharmacy electronically. 

## 2013-08-09 NOTE — Telephone Encounter (Signed)
Need a refill on Eliquis 5 mg # 60 please.

## 2013-10-05 ENCOUNTER — Ambulatory Visit (INDEPENDENT_AMBULATORY_CARE_PROVIDER_SITE_OTHER): Payer: Medicare Other | Admitting: Internal Medicine

## 2013-10-05 ENCOUNTER — Encounter: Payer: Self-pay | Admitting: Internal Medicine

## 2013-10-05 VITALS — BP 150/92 | HR 69 | Ht 60.0 in | Wt 184.3 lb

## 2013-10-05 DIAGNOSIS — Z79899 Other long term (current) drug therapy: Secondary | ICD-10-CM

## 2013-10-05 DIAGNOSIS — I4891 Unspecified atrial fibrillation: Secondary | ICD-10-CM

## 2013-10-05 DIAGNOSIS — I10 Essential (primary) hypertension: Secondary | ICD-10-CM

## 2013-10-05 MED ORDER — LOSARTAN POTASSIUM-HCTZ 50-12.5 MG PO TABS: 1.0000 | ORAL_TABLET | Freq: Every day | ORAL | Status: DC

## 2013-10-05 NOTE — Progress Notes (Signed)
OFFICE NOTE  Chief Complaint:  Routine follow-up  Primary Care Physician: Rochel Brome, MD  HPI:  Chelsea Phillips is a pleasant 80 year old female who still lives independently. She's been followed in the past by Dr. Rollene Fare prior to his retirement for atrial fibrillation. Her first episode of atrial fibrillation was 7 years ago and she had very little A. fib for which he was symptomatic until she was hospitalized in July of 2013 with A. fib and RVR. She was placed on Cardizem and Lopressor and her thyroid medicine was increased and she was noted to be hypothyroid. She spontaneously converted back to sinus and has been on warfarin. Recently she saw Dr. Rollene Fare and there was some question about possibly coming off of warfarin or switching her to an alternative NOAC.  She wore a monitor in April of this year and did have some atrial fibrillation detected. She denies any chest pain or worsening shortness of breath. She does have a history of some mild hypertension currently only on losartan and metoprolol.   Chelsea Phillips returns today for followup. She recently had a pneumonia about 2 months ago. This eventually resolved however she's had some mild shortness of breath and lower extremity swelling. I suspect this is from some diastolic heart failure. Her symptoms are slowly improving.  PMHx:  Past Medical History  Diagnosis Date  . Hiatal hernia   . Esophageal reflux   . Stricture and stenosis of esophagus   . Diverticulosis of colon (without mention of hemorrhage)   . Hypertension   . Arthritis   . Hypothyroidism   . Lumbar disc disease   . Venous insufficiency   . History of atrial fibrillation     with RVR (hopsitalized 12/2011) - converted spontanesouly    Past Surgical History  Procedure Laterality Date  . Abdominal hysterectomy    . Appendectomy    . Transthoracic echocardiogram  06/19/2012    EF 10-25%, grade 1 diastolic dysfunction; mild AV regurg; mild MR,  calcified MV annulus; mild AI; normal PA pressure     FAMHx:  Family History  Problem Relation Age of Onset  . Heart disease Mother   . Leukemia Sister     SOCHx:   reports that she has never smoked. She has never used smokeless tobacco. She reports that she does not drink alcohol or use illicit drugs.  ALLERGIES:  No Known Allergies  ROS: A comprehensive review of systems was negative except for: Respiratory: positive for dyspnea on exertion Cardiovascular: positive for lower extremity edema  HOME MEDS: Current Outpatient Prescriptions  Medication Sig Dispense Refill  . apixaban (ELIQUIS) 5 MG TABS tablet Take 1 tablet (5 mg total) by mouth 2 (two) times daily.  60 tablet  6  . esomeprazole (NEXIUM) 40 MG capsule Take 40 mg by mouth daily before breakfast.      . HYDROcodone-acetaminophen (NORCO/VICODIN) 5-325 MG per tablet Take 1 tablet by mouth every 6 (six) hours as needed for pain.      Marland Kitchen levothyroxine (SYNTHROID, LEVOTHROID) 75 MCG tablet Take 75 mcg by mouth daily before breakfast.      . metoprolol tartrate (LOPRESSOR) 25 MG tablet TAKE 1 TABLET BY MOUTH TWICE A DAY  120 tablet  2  . mometasone (NASONEX) 50 MCG/ACT nasal spray Place 2 sprays into the nose daily.      . solifenacin (VESICARE) 5 MG tablet Take 5 mg by mouth daily.      Marland Kitchen losartan-hydrochlorothiazide (HYZAAR) 50-12.5 MG per  tablet Take 1 tablet by mouth daily.  30 tablet  6   No current facility-administered medications for this visit.    LABS/IMAGING: No results found for this or any previous visit (from the past 48 hour(s)). No results found.  VITALS: BP 150/92  Pulse 69  Ht 5' (1.524 m)  Wt 184 lb 4.8 oz (83.598 kg)  BMI 35.99 kg/m2  EXAM: General appearance: alert and no distress Lungs: clear to auscultation bilaterally Heart: regular rate and rhythm, S1, S2 normal, no murmur, click, rub or gallop Abdomen: soft, non-tender; bowel sounds normal; no masses,  no organomegaly and  obese Extremities: edema 1+ bilaterally Pulses: 2+ and symmetric   EKG: Normal sinus rhythm at 69  ASSESSMENT: 1. Paroxysmal atrial fibrillation 2. Hypertension-controlled 3. Chronically anticoagulated on warfarin 4. Mild, diastolic heart failure  PLAN: 1.   Chelsea Phillips is doing well on Eliquis.  She is pleased I have to do regular fingerstick checks. She's had no bleeding complications. She is maintaining sinus rhythm. Unfortunately, she had a recent pneumonia and it does have some lower showed a swelling which may be mild diastolic heart failure. I recommend adding HCTZ to her losartan for mild diuresis. She should have a repeat metabolic profile next week when she has labs drawn at Dr. Alyse Low office.  Pixie Casino, MD, [facility] Attending Cardiologist [place] 10/05/2013, 1:04 PM

## 2013-10-05 NOTE — Patient Instructions (Signed)
Your physician has recommended you make the following change in your medication: STOP losartan. START losartan-hctz 50-12.5mg  once daily (for blood pressure.   Please have labs next week, when you go see Dr. Tobie Poet. Please have labs faxed to Dr. Debara Pickett at [phone]  Your physician wants you to follow-up in: 6 months with Dr. Debara Pickett. You will receive a reminder letter in the mail two months in advance. If you don't receive a letter, please call our office to schedule the follow-up appointment.

## 2014-01-25 ENCOUNTER — Other Ambulatory Visit: Payer: Self-pay | Admitting: Internal Medicine

## 2014-01-25 NOTE — Telephone Encounter (Signed)
Rx was sent to pharmacy electronically. 

## 2014-02-27 ENCOUNTER — Other Ambulatory Visit: Payer: Self-pay | Admitting: Pharmacist Clinician (PhC)/ Clinical Pharmacy Specialist

## 2014-02-27 MED ORDER — APIXABAN 5 MG PO TABS: 5.0000 mg | ORAL_TABLET | Freq: Two times a day (BID) | ORAL | Status: DC

## 2014-04-04 ENCOUNTER — Encounter: Payer: Self-pay | Admitting: Internal Medicine

## 2014-04-04 ENCOUNTER — Ambulatory Visit (INDEPENDENT_AMBULATORY_CARE_PROVIDER_SITE_OTHER): Payer: Medicare Other | Admitting: Internal Medicine

## 2014-04-04 VITALS — BP 120/70 | HR 63 | Ht 60.0 in | Wt 180.2 lb

## 2014-04-04 DIAGNOSIS — I482 Chronic atrial fibrillation, unspecified: Secondary | ICD-10-CM | POA: Insufficient documentation

## 2014-04-04 DIAGNOSIS — K219 Gastro-esophageal reflux disease without esophagitis: Secondary | ICD-10-CM

## 2014-04-04 DIAGNOSIS — I10 Essential (primary) hypertension: Secondary | ICD-10-CM

## 2014-04-04 DIAGNOSIS — I480 Paroxysmal atrial fibrillation: Secondary | ICD-10-CM

## 2014-04-04 MED ORDER — APIXABAN 5 MG PO TABS: 5.0000 mg | ORAL_TABLET | Freq: Two times a day (BID) | ORAL | Status: DC

## 2014-04-04 MED ORDER — ESOMEPRAZOLE MAGNESIUM 40 MG PO CPDR: 40.0000 mg | DELAYED_RELEASE_CAPSULE | Freq: Every day | ORAL | Status: DC

## 2014-04-04 NOTE — Patient Instructions (Signed)
Your physician wants you to follow-up in: 1 year with Dr. Hilty. You will receive a reminder letter in the mail two months in advance. If you don't receive a letter, please call our office to schedule the follow-up appointment.  

## 2014-04-04 NOTE — Addendum Note (Signed)
Addended by: Fidel Levy on: 04/04/2014 11:39 AM   Modules accepted: Orders

## 2014-04-04 NOTE — Progress Notes (Signed)
OFFICE NOTE  Chief Complaint:  Routine follow-up  Primary Care Physician: Rochel Brome, MD  HPI:  Chelsea Phillips is a pleasant 80 year old female who still lives independently. She's been followed in the past by Dr. Rollene Fare prior to his retirement for atrial fibrillation. Her first episode of atrial fibrillation was 7 years ago and she had very little A. fib for which he was symptomatic until she was hospitalized in July of 2013 with A. fib and RVR. She was placed on Cardizem and Lopressor and her thyroid medicine was increased and she was noted to be hypothyroid. She spontaneously converted back to sinus and has been on warfarin. Recently she saw Dr. Rollene Fare and there was some question about possibly coming off of warfarin or switching her to an alternative NOAC.  She wore a monitor in April of this year and did have some atrial fibrillation detected. She denies any chest pain or worsening shortness of breath. She does have a history of some mild hypertension currently only on losartan and metoprolol.   Chelsea Phillips returns today for followup.  She is doing well on Eliquis. She denies any palpitations or recurrent atrial fibrillation. She's had no bleeding complications. She recently had a minor fall and does have some bruising on her right shin but did not hit her head. She generally feels fairly stable and does use a cane for extra support. She reports the swelling has improved with the addition of a diuretic.  PMHx:  Past Medical History  Diagnosis Date  . Hiatal hernia   . Esophageal reflux   . Stricture and stenosis of esophagus   . Diverticulosis of colon (without mention of hemorrhage)   . Hypertension   . Arthritis   . Hypothyroidism   . Lumbar disc disease   . Venous insufficiency   . History of atrial fibrillation     with RVR (hopsitalized 12/2011) - converted spontanesouly    Past Surgical History  Procedure Laterality Date  . Abdominal hysterectomy    .  Appendectomy    . Transthoracic echocardiogram  06/19/2012    EF 16-10%, grade 1 diastolic dysfunction; mild AV regurg; mild MR, calcified MV annulus; mild AI; normal PA pressure     FAMHx:  Family History  Problem Relation Age of Onset  . Heart disease Mother   . Leukemia Sister     SOCHx:   reports that she has never smoked. She has never used smokeless tobacco. She reports that she does not drink alcohol or use illicit drugs.  ALLERGIES:  No Known Allergies  ROS: A comprehensive review of systems was negative except for: Hematologic/lymphatic: positive for easy bruising  HOME MEDS: Current Outpatient Prescriptions  Medication Sig Dispense Refill  . apixaban (ELIQUIS) 5 MG TABS tablet Take 1 tablet (5 mg total) by mouth 2 (two) times daily.  60 tablet  6  . esomeprazole (NEXIUM) 40 MG capsule Take 40 mg by mouth daily before breakfast.      . HYDROcodone-acetaminophen (NORCO/VICODIN) 5-325 MG per tablet Take 1 tablet by mouth every 6 (six) hours as needed for pain.      Marland Kitchen levothyroxine (SYNTHROID, LEVOTHROID) 75 MCG tablet Take 75 mcg by mouth daily before breakfast.      . losartan-hydrochlorothiazide (HYZAAR) 50-12.5 MG per tablet Take 1 tablet by mouth daily.  30 tablet  6  . metoprolol (LOPRESSOR) 50 MG tablet Take 0.5 tablets (25 mg total) by mouth 2 (two) times daily.  60 tablet  3  .  mometasone (NASONEX) 50 MCG/ACT nasal spray Place 2 sprays into the nose daily.       No current facility-administered medications for this visit.    LABS/IMAGING: No results found for this or any previous visit (from the past 48 hour(s)). No results found.  VITALS: BP 120/70  Pulse 63  Ht 5' (1.524 m)  Wt 180 lb 3.2 oz (81.738 kg)  BMI 35.19 kg/m2  EXAM: General appearance: alert and no distress Lungs: clear to auscultation bilaterally Heart: regular rate and rhythm, S1, S2 normal, no murmur, click, rub or gallop Abdomen: soft, non-tender; bowel sounds normal; no masses,  no  organomegaly and obese Extremities: edema trace Pulses: 2+ and symmetric   EKG: Normal sinus rhythm at 63  ASSESSMENT: 1. Paroxysmal atrial fibrillation 2. Hypertension-controlled 3. Chronically anticoagulated on warfarin 4. Leg edema - improved 5. GERD  PLAN: 1.   Chelsea Phillips is doing well on Eliquis.  Her lower extremity edema is improved with addition of low-dose hydrochlorothiazide. She is maintaining sinus rhythm. Her hypertension is well controlled. She's had no bleeding complications. She is requesting refills of her Nexium today. Plan to see her back annually as she remains very stable or sooner as necessary.  Pixie Casino, MD, [facility] Attending Cardiologist [facility]  Warren Lindahl C 04/04/2014, 11:28 AM

## 2014-04-18 ENCOUNTER — Other Ambulatory Visit: Payer: Self-pay | Admitting: Internal Medicine

## 2014-04-18 NOTE — Telephone Encounter (Signed)
Rx was sent to pharmacy electronically. 

## 2014-04-22 ENCOUNTER — Telehealth: Payer: Self-pay | Admitting: Internal Medicine

## 2014-04-22 NOTE — Telephone Encounter (Signed)
Refill was sent in on 10/29. Patient notified. She was advised to contact pharmacy

## 2014-04-22 NOTE — Telephone Encounter (Signed)
Pt called in stating that she is out of her Losartan and would like it refilled and called in to the [facility] on [address]. Please call  Thanks

## 2014-04-30 ENCOUNTER — Encounter: Payer: Self-pay | Admitting: Internal Medicine

## 2014-07-23 ENCOUNTER — Telehealth: Payer: Self-pay | Admitting: Internal Medicine

## 2014-07-23 NOTE — Telephone Encounter (Signed)
Pt received PA alert in mail. Checked for fax transmission here, nothing sent from [facility]. I called to request faxed PA form.

## 2014-07-23 NOTE — Telephone Encounter (Signed)
Pamala Hurry is calling because the insurance will not cover but one month of her medication (Eliquis 5mg ) and that he doctor has to send the prescription to [facility]  .Marland Kitchen The Fax is [phone]. Please call if you have any questions    Thanks

## 2014-07-25 NOTE — Telephone Encounter (Signed)
Faxed prior authorization for Eliquis 5mg  BID to [facility]

## 2014-07-25 NOTE — Telephone Encounter (Signed)
PA for Eliquis 5mg  BID approved thru 07/26/15 under Medicare Part D

## 2014-08-09 DIAGNOSIS — M150 Primary generalized (osteo)arthritis: Secondary | ICD-10-CM | POA: Diagnosis not present

## 2014-08-09 DIAGNOSIS — Z5181 Encounter for therapeutic drug level monitoring: Secondary | ICD-10-CM | POA: Diagnosis not present

## 2014-08-09 DIAGNOSIS — Z79899 Other long term (current) drug therapy: Secondary | ICD-10-CM | POA: Diagnosis not present

## 2014-08-20 DIAGNOSIS — M150 Primary generalized (osteo)arthritis: Secondary | ICD-10-CM | POA: Diagnosis not present

## 2014-08-20 DIAGNOSIS — R7301 Impaired fasting glucose: Secondary | ICD-10-CM | POA: Diagnosis not present

## 2014-08-20 DIAGNOSIS — E780 Pure hypercholesterolemia: Secondary | ICD-10-CM | POA: Diagnosis not present

## 2014-08-20 DIAGNOSIS — I10 Essential (primary) hypertension: Secondary | ICD-10-CM | POA: Diagnosis not present

## 2014-08-20 DIAGNOSIS — E034 Atrophy of thyroid (acquired): Secondary | ICD-10-CM | POA: Diagnosis not present

## 2014-08-23 DIAGNOSIS — M1712 Unilateral primary osteoarthritis, left knee: Secondary | ICD-10-CM | POA: Diagnosis not present

## 2014-10-02 ENCOUNTER — Emergency Department (HOSPITAL_COMMUNITY)
Admission: EM | Admit: 2014-10-02 | Discharge: 2014-10-02 | Disposition: A | Discharge status: Home / Self Care | Payer: Medicare Other | Attending: Emergency Medicine | Admitting: Emergency Medicine

## 2014-10-02 ENCOUNTER — Emergency Department (HOSPITAL_COMMUNITY): Payer: Medicare Other

## 2014-10-02 ENCOUNTER — Encounter (HOSPITAL_COMMUNITY): Payer: Self-pay

## 2014-10-02 DIAGNOSIS — I4891 Unspecified atrial fibrillation: Secondary | ICD-10-CM | POA: Diagnosis not present

## 2014-10-02 DIAGNOSIS — R110 Nausea: Secondary | ICD-10-CM | POA: Diagnosis not present

## 2014-10-02 DIAGNOSIS — Z79899 Other long term (current) drug therapy: Secondary | ICD-10-CM | POA: Insufficient documentation

## 2014-10-02 DIAGNOSIS — E039 Hypothyroidism, unspecified: Secondary | ICD-10-CM | POA: Diagnosis not present

## 2014-10-02 DIAGNOSIS — K219 Gastro-esophageal reflux disease without esophagitis: Secondary | ICD-10-CM | POA: Insufficient documentation

## 2014-10-02 DIAGNOSIS — Z9089 Acquired absence of other organs: Secondary | ICD-10-CM | POA: Diagnosis not present

## 2014-10-02 DIAGNOSIS — I10 Essential (primary) hypertension: Secondary | ICD-10-CM | POA: Diagnosis not present

## 2014-10-02 DIAGNOSIS — M1990 Unspecified osteoarthritis, unspecified site: Secondary | ICD-10-CM | POA: Insufficient documentation

## 2014-10-02 DIAGNOSIS — Z7901 Long term (current) use of anticoagulants: Secondary | ICD-10-CM | POA: Insufficient documentation

## 2014-10-02 DIAGNOSIS — K30 Functional dyspepsia: Secondary | ICD-10-CM | POA: Diagnosis not present

## 2014-10-02 DIAGNOSIS — Z7951 Long term (current) use of inhaled steroids: Secondary | ICD-10-CM | POA: Insufficient documentation

## 2014-10-02 DIAGNOSIS — Z90710 Acquired absence of both cervix and uterus: Secondary | ICD-10-CM | POA: Insufficient documentation

## 2014-10-02 LAB — LIPASE, BLOOD: Lipase: 26 U/L (ref 11–59)

## 2014-10-02 LAB — CBC WITH DIFFERENTIAL/PLATELET
Basophils Absolute: 0 10*3/uL (ref 0.0–0.1)
Basophils Relative: 0 % (ref 0–1)
Eosinophils Absolute: 0.1 10*3/uL (ref 0.0–0.7)
Eosinophils Relative: 1 % (ref 0–5)
HCT: 39.1 % (ref 36.0–46.0)
Hemoglobin: 12.9 g/dL (ref 12.0–15.0)
Lymphocytes Relative: 35 % (ref 12–46)
Lymphs Abs: 2.5 10*3/uL (ref 0.7–4.0)
MCH: 32.7 pg (ref 26.0–34.0)
MCHC: 33 g/dL (ref 30.0–36.0)
MCV: 99.2 fL (ref 78.0–100.0)
Monocytes Absolute: 0.5 10*3/uL (ref 0.1–1.0)
Monocytes Relative: 7 % (ref 3–12)
Neutro Abs: 4.2 10*3/uL (ref 1.7–7.7)
Neutrophils Relative %: 57 % (ref 43–77)
Platelets: 185 10*3/uL (ref 150–400)
RBC: 3.94 MIL/uL (ref 3.87–5.11)
RDW: 12.4 % (ref 11.5–15.5)
WBC: 7.4 10*3/uL (ref 4.0–10.5)

## 2014-10-02 LAB — COMPREHENSIVE METABOLIC PANEL
ALT: 12 U/L (ref 0–35)
AST: 20 U/L (ref 0–37)
Albumin: 3.7 g/dL (ref 3.5–5.2)
Alkaline Phosphatase: 40 U/L (ref 39–117)
Anion gap: 6 (ref 5–15)
BUN: 26 mg/dL — ABNORMAL HIGH (ref 6–23)
CO2: 27 mmol/L (ref 19–32)
Calcium: 9.6 mg/dL (ref 8.4–10.5)
Chloride: 106 mmol/L (ref 96–112)
Creatinine, Ser: 1.45 mg/dL — ABNORMAL HIGH (ref 0.50–1.10)
GFR calc Af Amer: 35 mL/min — ABNORMAL LOW (ref >90)
GFR calc non Af Amer: 30 mL/min — ABNORMAL LOW (ref >90)
Glucose, Bld: 126 mg/dL — ABNORMAL HIGH (ref 70–99)
Potassium: 3.9 mmol/L (ref 3.5–5.1)
Sodium: 139 mmol/L (ref 135–145)
Total Bilirubin: 1 mg/dL (ref 0.3–1.2)
Total Protein: 6.3 g/dL (ref 6.0–8.3)

## 2014-10-02 LAB — I-STAT TROPONIN, ED: Troponin i, poc: 0.01 ng/mL (ref 0.00–0.08)

## 2014-10-02 MED ORDER — GI COCKTAIL ~~LOC~~
30.0000 mL | Freq: Once | ORAL | Status: AC
  Administered 2014-10-02: 30 mL via ORAL
  Filled 2014-10-02: qty 30

## 2014-10-02 MED ORDER — ONDANSETRON 4 MG PO TBDP
4.0000 mg | ORAL_TABLET | Freq: Once | ORAL | Status: DC
  Filled 2014-10-02: qty 1

## 2014-10-02 NOTE — ED Notes (Signed)
Merry Proud, PA at the bedside.

## 2014-10-02 NOTE — ED Notes (Signed)
Lab at the bedside 

## 2014-10-02 NOTE — ED Notes (Signed)
Merry Proud, PA back at the bedside.

## 2014-10-02 NOTE — Discharge Instructions (Signed)
Continue taking your Nexium as prescribed by your primary care provider. Monitor for new or worsening signs or symptoms please contact her primary care provider today and inform them of your visit, with close follow-up. Please share all relevant documents from today's visit with her. If new or worsening signs or symptoms present please follow-up immediately. Avoid aggravating activities, foods.

## 2014-10-02 NOTE — ED Notes (Signed)
Pt here from home for reflux. Pt is nauseated and has been belching for about a week now. No vomiting. No chest pain.

## 2014-10-02 NOTE — ED Notes (Signed)
Philippa Chester, phleb at bedside along with Angie Fava, RN and Merry Proud, [place] in room speaking with pt; visitor at bedside

## 2014-10-02 NOTE — ED Provider Notes (Signed)
Medical screening examination/treatment/procedure(s) were conducted as a shared visit with non-physician practitioner(s) and myself.  I personally evaluated the patient during the encounter.   EKG Interpretation   Date/Time:  Wednesday October 02 2014 09:59:48 EDT Ventricular Rate:  68 PR Interval:  163 QRS Duration: 92 QT Interval:  516 QTC Calculation: 549 R Axis:   -35 Text Interpretation:  Sinus rhythm Inferior infarct, old Anterior infarct,  old Prolonged QT interval No old tracing to compare Confirmed by Junice Fei,   DO, Kelley Knoth ([id number]) on 10/02/2014 10:40:16 AM      Pt is a 81 y.o. female with history of esophageal reflux, hiatal hernia, hypertension, hypothyroidism, atrial fibrillation on Eliquis who presents to the emergency department for week and a half of belching. She's had nausea but no vomiting or diarrhea. No chest pain or chest discomfort. No shortness of breath. Has had similar symptoms with her reflux in the past. Is taking Nexium every day and had a dose at 7 AM this morning. Has tried ginger ale, saltine crackers at home without relief. Denies abdominal pain. On exam patient is elderly but nontoxic, pleasant. Heart and lung sounds normal. Abdomen soft and nontender to palpation. No peritoneal signs.  Patient feels better after GI cocktail. Workup in the ED has been unremarkable including normal LFTs, lipase, troponin. Her creatinine is mildly elevated at 1.45 but her last creatinine was 3 years ago and was 1.2. We'll have her follow-up with her primary care physician for this.  [place], DO 10/02/14 1202

## 2014-10-02 NOTE — ED Notes (Signed)
Dr. Ward at the bedside.  

## 2014-10-02 NOTE — ED Provider Notes (Signed)
CSN: [id number]     Arrival date & time 10/02/14  3748 History   First MD Initiated Contact with Patient 10/02/14 [phone]     Chief Complaint  Patient presents with  . Gastrophageal Reflux   HPI   65 YOF presents with complaints of belching. Pt reports a long history of GERD. She reports that she has been taking Nexium for this and states that it intermittently works. She notes a worsening of belching symptoms over the past week and a half, with intermittent nausea. Patient reports that she does not have the sensation of reflux, no burning, no sour taste in mouth, no vomiting. She denies fever, chest pain, shortness of breath, abdominal pain, painful or difficult swallowing. She reports that the belching is made better with the supine position, and worse when she sits up. Patient reports that recently she has been under increased stress as her house will be taken from her. Reports seeing her primary care provider 2 weeks ago, but was not having the severity symptoms at that time. Patient denies increased belching with by mouth intake.    Past Medical History  Diagnosis Date  . Hiatal hernia   . Esophageal reflux   . Stricture and stenosis of esophagus   . Diverticulosis of colon (without mention of hemorrhage)   . Hypertension   . Arthritis   . Hypothyroidism   . Lumbar disc disease   . Venous insufficiency   . History of atrial fibrillation     with RVR (hopsitalized 12/2011) - converted spontanesouly   Past Surgical History  Procedure Laterality Date  . Abdominal hysterectomy    . Appendectomy    . Transthoracic echocardiogram  06/19/2012    EF 86-75%, grade 1 diastolic dysfunction; mild AV regurg; mild MR, calcified MV annulus; mild AI; normal PA pressure    Family History  Problem Relation Age of Onset  . Heart disease Mother   . Leukemia Sister    History  Substance Use Topics  . Smoking status: Never Smoker   . Smokeless tobacco: Never Used  . Alcohol Use: No   OB History     No data available     Review of Systems  All other systems reviewed and are negative.   Allergies  Review of patient's allergies indicates no known allergies.  Home Medications   Prior to Admission medications   Medication Sig Start Date End Date Taking? Authorizing Provider  apixaban (ELIQUIS) 5 MG TABS tablet Take 1 tablet (5 mg total) by mouth 2 (two) times daily. 04/04/14   Pixie Casino, MD  esomeprazole (NEXIUM) 40 MG capsule Take 1 capsule (40 mg total) by mouth daily before breakfast. 04/04/14   Pixie Casino, MD  HYDROcodone-acetaminophen (NORCO/VICODIN) 5-325 MG per tablet Take 1 tablet by mouth every 6 (six) hours as needed for pain.    Historical Provider, MD  levothyroxine (SYNTHROID, LEVOTHROID) 75 MCG tablet Take 75 mcg by mouth daily before breakfast.    Historical Provider, MD  losartan-hydrochlorothiazide (HYZAAR) 50-12.5 MG per tablet TAKE 1 TABLET BY MOUTH DAILY. 04/18/14   Pixie Casino, MD  metoprolol (LOPRESSOR) 50 MG tablet Take 0.5 tablets (25 mg total) by mouth 2 (two) times daily. 01/25/14   Pixie Casino, MD  mometasone (NASONEX) 50 MCG/ACT nasal spray Place 2 sprays into the nose daily.    Historical Provider, MD   BP 110/56 mmHg  Pulse 71  Temp(Src) 97.7 F (36.5 C) (Oral)  Resp 14  Ht 5' (  1.524 m)  Wt 180 lb (81.647 kg)  BMI 35.15 kg/m2  SpO2 96% Physical Exam  Constitutional: She is oriented to person, place, and time. She appears well-developed and well-nourished.  HENT:  Head: Normocephalic and atraumatic.  Mouth/Throat: Uvula is midline, oropharynx is clear and moist and mucous membranes are normal. No oral lesions. No trismus in the jaw. Normal dentition. No uvula swelling. No oropharyngeal exudate, posterior oropharyngeal edema or posterior oropharyngeal erythema.  Eyes: Pupils are equal, round, and reactive to light.  Neck: Normal range of motion. Neck supple. No JVD present. No tracheal deviation present. No thyromegaly present.   Cardiovascular: Normal rate, normal heart sounds and intact distal pulses.  Exam reveals no gallop and no friction rub.   No murmur heard. Pulmonary/Chest: Effort normal and breath sounds normal. No stridor. No respiratory distress. She has no wheezes. She has no rales. She exhibits no tenderness.  Abdominal: Soft. Bowel sounds are normal. She exhibits no distension and no mass. There is no tenderness. There is no rebound and no guarding.  Musculoskeletal: Normal range of motion.  Lymphadenopathy:    She has no cervical adenopathy.  Neurological: She is alert and oriented to person, place, and time. Coordination normal.  Skin: Skin is warm and dry.  Psychiatric: She has a normal mood and affect. Her behavior is normal. Judgment and thought content normal.  Nursing note and vitals reviewed.  ED Course  Procedures (including critical care time) Labs Review Labs Reviewed - No data to display  Imaging Review No results found.   EKG Interpretation   Date/Time:  Wednesday October 02 2014 09:59:48 EDT Ventricular Rate:  68 PR Interval:  163 QRS Duration: 92 QT Interval:  516 QTC Calculation: 549 R Axis:   -35 Text Interpretation:  Sinus rhythm Inferior infarct, old Anterior infarct,  old Prolonged QT interval No old tracing to compare Confirmed by WARD,   DO, KRISTEN ([id number]) on 10/02/2014 10:40:16 AM     MDM   Final diagnoses:  Indigestion    Labs: I-STAT troponin, CMP, CBC, lipase- creatinine elevated 1.45 from 1.23 years prior  Imaging: DG acute abdomen with chest no findings  Consults: None  Therapeutics: GI cocktail  Assessment: Indigestion  Plan: Patient's presentation likely indigestion, history of the same, similar symptoms with minor increase in frequency of belching. Patient has no additional complaints in addition to belching and nausea. No signs of fever, abdominal pain, changes in bowel or bladder functioning characteristics. Patient was given a GI cocktail with  improvement in symptoms reporting only 2 episodes of belching in one hour. She has history of hiatal hernia, and has been under increased stress recently. No acute concerns for additional diagnosis in addition to the indigestion. She does not endorse difficulty breathing, swallowing, painful swallowing. Patient was instructed to continue her Nexium at the 40 mg dose is. Patient prescribed, use Maalox as needed for additional relief, and contact her primary care provider today and inform them of the visit patient had no concerns at time of discharge, understood and agreed to the plan.     Okey Regal, PA-C 10/04/14 1229

## 2014-10-02 NOTE — ED Notes (Signed)
Daughter sts she has only belched once since the medication and then pt belched while this RN in the room.

## 2014-10-02 NOTE — ED Notes (Signed)
Patient transported to X-ray 

## 2014-10-07 DIAGNOSIS — T7840XA Allergy, unspecified, initial encounter: Secondary | ICD-10-CM | POA: Diagnosis not present

## 2014-10-07 DIAGNOSIS — L300 Nummular dermatitis: Secondary | ICD-10-CM | POA: Diagnosis not present

## 2014-10-16 ENCOUNTER — Other Ambulatory Visit: Payer: Self-pay | Admitting: Internal Medicine

## 2014-10-16 NOTE — Telephone Encounter (Signed)
Rx refill sent to patient pharmacy   

## 2014-10-30 ENCOUNTER — Other Ambulatory Visit: Payer: Self-pay | Admitting: Internal Medicine

## 2014-10-30 NOTE — Telephone Encounter (Signed)
Rx refill sent to patient pharmacy   

## 2014-12-02 ENCOUNTER — Other Ambulatory Visit: Payer: Self-pay | Admitting: Internal Medicine

## 2014-12-12 DIAGNOSIS — J3089 Other allergic rhinitis: Secondary | ICD-10-CM | POA: Diagnosis not present

## 2014-12-12 DIAGNOSIS — R7301 Impaired fasting glucose: Secondary | ICD-10-CM | POA: Diagnosis not present

## 2014-12-12 DIAGNOSIS — K219 Gastro-esophageal reflux disease without esophagitis: Secondary | ICD-10-CM | POA: Diagnosis not present

## 2014-12-12 DIAGNOSIS — E780 Pure hypercholesterolemia: Secondary | ICD-10-CM | POA: Diagnosis not present

## 2014-12-12 DIAGNOSIS — I10 Essential (primary) hypertension: Secondary | ICD-10-CM | POA: Diagnosis not present

## 2015-01-27 ENCOUNTER — Ambulatory Visit (INDEPENDENT_AMBULATORY_CARE_PROVIDER_SITE_OTHER): Payer: Medicare Other | Admitting: Podiatry

## 2015-01-27 ENCOUNTER — Encounter: Payer: Self-pay | Admitting: Podiatry

## 2015-01-27 VITALS — BP 125/58 | HR 65 | Resp 14 | Ht 62.0 in | Wt 175.0 lb

## 2015-01-27 DIAGNOSIS — M79676 Pain in unspecified toe(s): Secondary | ICD-10-CM | POA: Diagnosis not present

## 2015-01-27 DIAGNOSIS — B351 Tinea unguium: Secondary | ICD-10-CM | POA: Diagnosis not present

## 2015-01-27 NOTE — Progress Notes (Signed)
   Subjective:    Patient ID: Chelsea Phillips, female    DOB: 1921-07-19, 81 y.o.   MRN: [id number]  HPI  Patient presents here today for a B/L toenail trim. This patient presents to office for preventive footcare services.  She says the nails are painful walking and wearing her shoes.  Review of Systems  Constitutional: Positive for fatigue.  HENT: Positive for hearing loss.        Sinus problems  Genitourinary: Positive for frequency.  All other systems reviewed and are negative.      Objective:   Physical Exam GENERAL APPEARANCE: Alert, conversant. Appropriately groomed. No acute distress.  VASCULAR: Pedal pulses palpable at  [facility] and PT bilateral.  Capillary refill time is immediate to all digits,  Normal temperature gradient.  Digital hair growth is present bilateral  NEUROLOGIC: sensation is normal to 5.07 monofilament at 5/5 sites bilateral.  Light touch is intact bilateral, Muscle strength normal.  MUSCULOSKELETAL: acceptable muscle strength, tone and stability bilateral.  Intrinsic muscluature intact bilateral.  Rectus appearance of foot and digits noted bilateral.   DERMATOLOGIC: skin color, texture, and turgor are within normal limits.  No preulcerative lesions or ulcers  are seen, no interdigital maceration noted.  No open lesions present.  . No drainage noted. Thick disfigured discolored nails both fee         Assessment & Plan:  Onychomycosis   IE  Debridement and Grinding of long painful nails both feet.  RTC 3 months

## 2015-02-10 DIAGNOSIS — H524 Presbyopia: Secondary | ICD-10-CM | POA: Diagnosis not present

## 2015-02-10 DIAGNOSIS — H04123 Dry eye syndrome of bilateral lacrimal glands: Secondary | ICD-10-CM | POA: Diagnosis not present

## 2015-02-10 DIAGNOSIS — H26493 Other secondary cataract, bilateral: Secondary | ICD-10-CM | POA: Diagnosis not present

## 2015-02-26 DIAGNOSIS — R51 Headache: Secondary | ICD-10-CM | POA: Diagnosis not present

## 2015-02-26 DIAGNOSIS — M25552 Pain in left hip: Secondary | ICD-10-CM | POA: Diagnosis not present

## 2015-02-26 DIAGNOSIS — R404 Transient alteration of awareness: Secondary | ICD-10-CM | POA: Diagnosis not present

## 2015-02-26 DIAGNOSIS — N189 Chronic kidney disease, unspecified: Secondary | ICD-10-CM | POA: Diagnosis not present

## 2015-02-26 DIAGNOSIS — M25551 Pain in right hip: Secondary | ICD-10-CM | POA: Diagnosis not present

## 2015-02-26 DIAGNOSIS — S299XXA Unspecified injury of thorax, initial encounter: Secondary | ICD-10-CM | POA: Diagnosis not present

## 2015-02-26 DIAGNOSIS — R0602 Shortness of breath: Secondary | ICD-10-CM | POA: Diagnosis not present

## 2015-02-26 DIAGNOSIS — R42 Dizziness and giddiness: Secondary | ICD-10-CM | POA: Diagnosis not present

## 2015-02-26 DIAGNOSIS — I4891 Unspecified atrial fibrillation: Secondary | ICD-10-CM | POA: Diagnosis not present

## 2015-02-26 DIAGNOSIS — S0990XA Unspecified injury of head, initial encounter: Secondary | ICD-10-CM | POA: Diagnosis not present

## 2015-02-26 DIAGNOSIS — I129 Hypertensive chronic kidney disease with stage 1 through stage 4 chronic kidney disease, or unspecified chronic kidney disease: Secondary | ICD-10-CM | POA: Diagnosis not present

## 2015-03-19 ENCOUNTER — Encounter: Payer: Self-pay | Admitting: Internal Medicine

## 2015-03-19 ENCOUNTER — Ambulatory Visit (INDEPENDENT_AMBULATORY_CARE_PROVIDER_SITE_OTHER): Payer: Medicare Other | Admitting: Internal Medicine

## 2015-03-19 VITALS — BP 128/62 | HR 64 | Ht 60.0 in | Wt 177.1 lb

## 2015-03-19 DIAGNOSIS — I480 Paroxysmal atrial fibrillation: Secondary | ICD-10-CM | POA: Diagnosis not present

## 2015-03-19 DIAGNOSIS — I10 Essential (primary) hypertension: Secondary | ICD-10-CM | POA: Diagnosis not present

## 2015-03-19 DIAGNOSIS — E038 Other specified hypothyroidism: Secondary | ICD-10-CM | POA: Diagnosis not present

## 2015-03-19 NOTE — Progress Notes (Signed)
OFFICE NOTE  Chief Complaint:  Routine follow-up  Primary Care Physician: Chelsea Brome, MD  HPI:  Chelsea Phillips is a pleasant 81 year old female who still lives independently. She's been followed in the past by Dr. Rollene Phillips prior to his retirement for atrial fibrillation. Her first episode of atrial fibrillation was 7 years ago and she had very little A. fib for which he was symptomatic until she was hospitalized in July of 2013 with A. fib and RVR. She was placed on Cardizem and Lopressor and her thyroid medicine was increased and she was noted to be hypothyroid. She spontaneously converted back to sinus and has been on warfarin. Recently she saw Dr. Rollene Phillips and there was some question about possibly coming off of warfarin or switching her to an alternative NOAC.  She wore a monitor in April of this year and did have some atrial fibrillation detected. She denies any chest pain or worsening shortness of breath. She does have a history of some mild hypertension currently only on losartan and metoprolol.   Mrs. Chelsea Phillips returns today for followup.  She is doing well on Eliquis. She denies any palpitations or recurrent atrial fibrillation. She's had no bleeding complications. She recently had a minor fall and does have some bruising on her right shin but did not hit her head. She generally feels fairly stable and does use a cane for extra support. She reports the swelling has improved with the addition of a diuretic.  I had the pleasure of seeing Mrs. Chelsea Phillips back in the office today. Overall she seems to be doing fairly well. She unfortunately had one fall which was related to dizziness where she was getting into her car and fell in her car port. She did hit her head on the concrete. She was to get emergency department but did not undergo CT scanning, despite being on Eliquis. Fortunately there was no intracranial bleed. Subsequently she's had no further falls. She's had no bleeding  episodes related to Eliquis. Heart rate seems to be well-controlled and she is in sinus rhythm (or a low atrial rhythm) today.  PMHx:  Past Medical History  Diagnosis Date  . Hiatal hernia   . Esophageal reflux   . Stricture and stenosis of esophagus   . Diverticulosis of colon (without mention of hemorrhage)   . Hypertension   . Arthritis   . Hypothyroidism   . Lumbar disc disease   . Venous insufficiency   . History of atrial fibrillation     with RVR (hopsitalized 12/2011) - converted spontanesouly    Past Surgical History  Procedure Laterality Date  . Abdominal hysterectomy    . Appendectomy    . Transthoracic echocardiogram  06/19/2012    EF 08-67%, grade 1 diastolic dysfunction; mild AV regurg; mild MR, calcified MV annulus; mild AI; normal PA pressure     FAMHx:  Family History  Problem Relation Age of Onset  . Heart disease Mother   . Leukemia Sister     SOCHx:   reports that she has never smoked. She has never used smokeless tobacco. She reports that she does not drink alcohol or use illicit drugs.  ALLERGIES:  No Known Allergies  ROS: A comprehensive review of systems was negative except for: Hematologic/lymphatic: positive for easy bruising  HOME MEDS: Current Outpatient Prescriptions  Medication Sig Dispense Refill  . ELIQUIS 5 MG TABS tablet TAKE 1 TABLET TWICE A DAY 60 tablet 3  . esomeprazole (NEXIUM) 40 MG capsule Take 1  capsule (40 mg total) by mouth daily before breakfast. 90 capsule 3  . HYDROcodone-acetaminophen (NORCO/VICODIN) 5-325 MG per tablet Take 1 tablet by mouth every 6 (six) hours as needed for pain.    Marland Kitchen levothyroxine (SYNTHROID, LEVOTHROID) 75 MCG tablet Take 75 mcg by mouth daily before breakfast.    . losartan-hydrochlorothiazide (HYZAAR) 50-12.5 MG per tablet TAKE 1 TABLET BY MOUTH DAILY. 30 tablet 11  . metoprolol (LOPRESSOR) 50 MG tablet Take 0.5 tablets (25 mg total) by mouth 2 (two) times daily. 30 tablet 5  . metoprolol  (LOPRESSOR) 50 MG tablet Take 0.5 tablets (25 mg total) by mouth 2 (two) times daily. 60 tablet 3  . niacin (NIASPAN) 500 MG CR tablet Take 500 mg by mouth at bedtime.  2  . solifenacin (VESICARE) 5 MG tablet Take 5 mg by mouth daily.     No current facility-administered medications for this visit.    LABS/IMAGING: No results found for this or any previous visit (from the past 48 hour(s)). No results found.  VITALS: BP 128/62 mmHg  Pulse 64  Ht 5' (1.524 m)  Wt 177 lb 1.6 oz (80.332 kg)  BMI 34.59 kg/m2  EXAM: General appearance: alert and no distress Lungs: clear to auscultation bilaterally Heart: regular rate and rhythm, S1, S2 normal, no murmur, click, rub or gallop Abdomen: soft, non-tender; bowel sounds normal; no masses,  no organomegaly and obese Extremities: edema trace Pulses: 2+ and symmetric   EKG: Normal sinus rhythm at 64, mild anterolateral T-wave changes.  ASSESSMENT: 1. Paroxysmal atrial fibrillation - CHADSVASC 4 2. Hypertension-controlled 3. Chronically anticoagulated on warfarin 4. Leg edema - improved 5. GERD  PLAN: 1.   Chelsea Phillips is doing well on Eliquis.  Despite her single fall, she's had no other concerning falls or increased risk for taking her off of Eliquis. She does not appear to be having recurrent atrial fibrillation. Her lower extremity edema is improved with addition of low-dose hydrochlorothiazide. She is maintaining sinus rhythm. Her hypertension is well controlled. Plan to see her back annually as she remains very stable or sooner as necessary.  Pixie Casino, MD, [facility] Attending Cardiologist [place] 03/19/2015, 12:46 PM

## 2015-03-19 NOTE — Patient Instructions (Addendum)
Your physician wants you to follow-up in: 1 year with Dr. Debara Pickett. You will receive a reminder letter in the mail two months in advance. If you don't receive a letter, please call our office to schedule the follow-up appointment.  eliquis samples - 5 boxes - given to patient.

## 2015-03-20 ENCOUNTER — Other Ambulatory Visit: Payer: Self-pay | Admitting: Internal Medicine

## 2015-03-20 NOTE — Telephone Encounter (Signed)
REFILL 

## 2015-03-24 DIAGNOSIS — R7301 Impaired fasting glucose: Secondary | ICD-10-CM | POA: Diagnosis not present

## 2015-03-24 DIAGNOSIS — E034 Atrophy of thyroid (acquired): Secondary | ICD-10-CM | POA: Diagnosis not present

## 2015-03-24 DIAGNOSIS — E782 Mixed hyperlipidemia: Secondary | ICD-10-CM | POA: Diagnosis not present

## 2015-03-24 DIAGNOSIS — I10 Essential (primary) hypertension: Secondary | ICD-10-CM | POA: Diagnosis not present

## 2015-03-28 DIAGNOSIS — M810 Age-related osteoporosis without current pathological fracture: Secondary | ICD-10-CM | POA: Diagnosis not present

## 2015-05-16 DIAGNOSIS — H9209 Otalgia, unspecified ear: Secondary | ICD-10-CM | POA: Diagnosis not present

## 2015-05-22 DIAGNOSIS — S01311A Laceration without foreign body of right ear, initial encounter: Secondary | ICD-10-CM | POA: Diagnosis not present

## 2015-05-30 DIAGNOSIS — S01311A Laceration without foreign body of right ear, initial encounter: Secondary | ICD-10-CM | POA: Diagnosis not present

## 2015-06-11 DIAGNOSIS — T161XXA Foreign body in right ear, initial encounter: Secondary | ICD-10-CM | POA: Diagnosis not present

## 2015-07-11 DIAGNOSIS — I10 Essential (primary) hypertension: Secondary | ICD-10-CM | POA: Diagnosis not present

## 2015-07-11 DIAGNOSIS — R7301 Impaired fasting glucose: Secondary | ICD-10-CM | POA: Diagnosis not present

## 2015-07-11 DIAGNOSIS — E782 Mixed hyperlipidemia: Secondary | ICD-10-CM | POA: Diagnosis not present

## 2015-07-11 DIAGNOSIS — K219 Gastro-esophageal reflux disease without esophagitis: Secondary | ICD-10-CM | POA: Diagnosis not present

## 2015-09-21 DIAGNOSIS — G501 Atypical facial pain: Secondary | ICD-10-CM | POA: Diagnosis not present

## 2015-09-26 DIAGNOSIS — L820 Inflamed seborrheic keratosis: Secondary | ICD-10-CM | POA: Diagnosis not present

## 2015-10-16 DIAGNOSIS — E038 Other specified hypothyroidism: Secondary | ICD-10-CM | POA: Diagnosis not present

## 2015-10-16 DIAGNOSIS — E782 Mixed hyperlipidemia: Secondary | ICD-10-CM | POA: Diagnosis not present

## 2015-10-16 DIAGNOSIS — I10 Essential (primary) hypertension: Secondary | ICD-10-CM | POA: Diagnosis not present

## 2015-10-16 DIAGNOSIS — I482 Chronic atrial fibrillation: Secondary | ICD-10-CM | POA: Diagnosis not present

## 2015-10-16 DIAGNOSIS — K219 Gastro-esophageal reflux disease without esophagitis: Secondary | ICD-10-CM | POA: Diagnosis not present

## 2015-10-16 DIAGNOSIS — R7301 Impaired fasting glucose: Secondary | ICD-10-CM | POA: Diagnosis not present

## 2015-10-27 DIAGNOSIS — K5900 Constipation, unspecified: Secondary | ICD-10-CM | POA: Diagnosis not present

## 2015-10-27 DIAGNOSIS — N3281 Overactive bladder: Secondary | ICD-10-CM | POA: Diagnosis not present

## 2015-10-30 DIAGNOSIS — I10 Essential (primary) hypertension: Secondary | ICD-10-CM | POA: Diagnosis not present

## 2015-11-27 DIAGNOSIS — N3281 Overactive bladder: Secondary | ICD-10-CM | POA: Diagnosis not present

## 2015-12-09 DIAGNOSIS — L820 Inflamed seborrheic keratosis: Secondary | ICD-10-CM | POA: Diagnosis not present

## 2015-12-09 DIAGNOSIS — L578 Other skin changes due to chronic exposure to nonionizing radiation: Secondary | ICD-10-CM | POA: Diagnosis not present

## 2015-12-09 DIAGNOSIS — L570 Actinic keratosis: Secondary | ICD-10-CM | POA: Diagnosis not present

## 2015-12-09 DIAGNOSIS — L821 Other seborrheic keratosis: Secondary | ICD-10-CM | POA: Diagnosis not present

## 2015-12-31 DIAGNOSIS — K5900 Constipation, unspecified: Secondary | ICD-10-CM | POA: Diagnosis not present

## 2015-12-31 DIAGNOSIS — N3281 Overactive bladder: Secondary | ICD-10-CM | POA: Diagnosis not present

## 2016-01-14 DIAGNOSIS — J9601 Acute respiratory failure with hypoxia: Secondary | ICD-10-CM | POA: Diagnosis not present

## 2016-01-14 DIAGNOSIS — R072 Precordial pain: Secondary | ICD-10-CM | POA: Diagnosis not present

## 2016-01-14 DIAGNOSIS — N289 Disorder of kidney and ureter, unspecified: Secondary | ICD-10-CM | POA: Diagnosis not present

## 2016-01-14 DIAGNOSIS — Z79899 Other long term (current) drug therapy: Secondary | ICD-10-CM | POA: Diagnosis not present

## 2016-01-14 DIAGNOSIS — M479 Spondylosis, unspecified: Secondary | ICD-10-CM | POA: Diagnosis not present

## 2016-01-14 DIAGNOSIS — E039 Hypothyroidism, unspecified: Secondary | ICD-10-CM | POA: Diagnosis not present

## 2016-01-14 DIAGNOSIS — Z9981 Dependence on supplemental oxygen: Secondary | ICD-10-CM | POA: Diagnosis not present

## 2016-01-14 DIAGNOSIS — R079 Chest pain, unspecified: Secondary | ICD-10-CM | POA: Diagnosis not present

## 2016-01-14 DIAGNOSIS — I129 Hypertensive chronic kidney disease with stage 1 through stage 4 chronic kidney disease, or unspecified chronic kidney disease: Secondary | ICD-10-CM | POA: Diagnosis not present

## 2016-01-14 DIAGNOSIS — G8929 Other chronic pain: Secondary | ICD-10-CM | POA: Diagnosis not present

## 2016-01-14 DIAGNOSIS — K219 Gastro-esophageal reflux disease without esophagitis: Secondary | ICD-10-CM | POA: Diagnosis not present

## 2016-01-14 DIAGNOSIS — R0600 Dyspnea, unspecified: Secondary | ICD-10-CM | POA: Diagnosis not present

## 2016-01-14 DIAGNOSIS — N183 Chronic kidney disease, stage 3 (moderate): Secondary | ICD-10-CM | POA: Diagnosis not present

## 2016-01-14 DIAGNOSIS — R0902 Hypoxemia: Secondary | ICD-10-CM | POA: Diagnosis not present

## 2016-01-14 DIAGNOSIS — M2170 Unequal limb length (acquired), unspecified site: Secondary | ICD-10-CM | POA: Diagnosis not present

## 2016-01-14 DIAGNOSIS — R0602 Shortness of breath: Secondary | ICD-10-CM | POA: Diagnosis not present

## 2016-01-14 DIAGNOSIS — I4891 Unspecified atrial fibrillation: Secondary | ICD-10-CM | POA: Diagnosis not present

## 2016-01-14 DIAGNOSIS — K5730 Diverticulosis of large intestine without perforation or abscess without bleeding: Secondary | ICD-10-CM | POA: Diagnosis not present

## 2016-01-14 DIAGNOSIS — Z7901 Long term (current) use of anticoagulants: Secondary | ICD-10-CM | POA: Diagnosis not present

## 2016-01-14 DIAGNOSIS — J45909 Unspecified asthma, uncomplicated: Secondary | ICD-10-CM | POA: Diagnosis not present

## 2016-01-14 DIAGNOSIS — K30 Functional dyspepsia: Secondary | ICD-10-CM | POA: Diagnosis not present

## 2016-01-14 DIAGNOSIS — I119 Hypertensive heart disease without heart failure: Secondary | ICD-10-CM | POA: Diagnosis not present

## 2016-01-14 DIAGNOSIS — I482 Chronic atrial fibrillation: Secondary | ICD-10-CM | POA: Diagnosis not present

## 2016-01-15 DIAGNOSIS — R0789 Other chest pain: Secondary | ICD-10-CM | POA: Diagnosis not present

## 2016-01-15 DIAGNOSIS — I119 Hypertensive heart disease without heart failure: Secondary | ICD-10-CM | POA: Diagnosis not present

## 2016-01-15 DIAGNOSIS — N289 Disorder of kidney and ureter, unspecified: Secondary | ICD-10-CM | POA: Diagnosis not present

## 2016-01-15 DIAGNOSIS — I351 Nonrheumatic aortic (valve) insufficiency: Secondary | ICD-10-CM | POA: Diagnosis not present

## 2016-01-15 DIAGNOSIS — I4891 Unspecified atrial fibrillation: Secondary | ICD-10-CM | POA: Diagnosis not present

## 2016-01-21 DIAGNOSIS — I160 Hypertensive urgency: Secondary | ICD-10-CM | POA: Diagnosis not present

## 2016-01-21 DIAGNOSIS — R7301 Impaired fasting glucose: Secondary | ICD-10-CM | POA: Diagnosis not present

## 2016-01-30 DIAGNOSIS — E782 Mixed hyperlipidemia: Secondary | ICD-10-CM | POA: Diagnosis not present

## 2016-01-30 DIAGNOSIS — R7301 Impaired fasting glucose: Secondary | ICD-10-CM | POA: Diagnosis not present

## 2016-01-30 DIAGNOSIS — I10 Essential (primary) hypertension: Secondary | ICD-10-CM | POA: Diagnosis not present

## 2016-02-11 DIAGNOSIS — H26493 Other secondary cataract, bilateral: Secondary | ICD-10-CM | POA: Diagnosis not present

## 2016-03-03 DIAGNOSIS — Z0000 Encounter for general adult medical examination without abnormal findings: Secondary | ICD-10-CM | POA: Diagnosis not present

## 2016-03-08 ENCOUNTER — Other Ambulatory Visit: Payer: Self-pay | Admitting: Internal Medicine

## 2016-03-09 ENCOUNTER — Ambulatory Visit (INDEPENDENT_AMBULATORY_CARE_PROVIDER_SITE_OTHER): Payer: Medicare Other | Admitting: Internal Medicine

## 2016-03-09 VITALS — BP 106/70 | HR 63 | Ht 60.0 in | Wt 181.0 lb

## 2016-03-09 DIAGNOSIS — Z7901 Long term (current) use of anticoagulants: Secondary | ICD-10-CM | POA: Diagnosis not present

## 2016-03-09 DIAGNOSIS — I10 Essential (primary) hypertension: Secondary | ICD-10-CM

## 2016-03-09 DIAGNOSIS — I480 Paroxysmal atrial fibrillation: Secondary | ICD-10-CM

## 2016-03-09 LAB — BASIC METABOLIC PANEL
BUN: 29 mg/dL — ABNORMAL HIGH (ref 7–25)
CO2: 23 mmol/L (ref 20–31)
Calcium: 9 mg/dL (ref 8.6–10.4)
Chloride: 103 mmol/L (ref 98–110)
Creat: 1.61 mg/dL — ABNORMAL HIGH (ref 0.60–0.88)
Glucose, Bld: 84 mg/dL (ref 65–99)
Potassium: 4.7 mmol/L (ref 3.5–5.3)
SODIUM: 137 mmol/L (ref 135–146)

## 2016-03-09 LAB — CBC
HCT: 41.2 % (ref 35.0–45.0)
HEMOGLOBIN: 13.8 g/dL (ref 11.7–15.5)
MCH: 32.5 pg (ref 27.0–33.0)
MCHC: 33.5 g/dL (ref 32.0–36.0)
MCV: 96.9 fL (ref 80.0–100.0)
MPV: 10.1 fL (ref 7.5–12.5)
Platelets: 219 10*3/uL (ref 140–400)
RBC: 4.25 MIL/uL (ref 3.80–5.10)
RDW: 13 % (ref 11.0–15.0)
WBC: 8.1 10*3/uL (ref 3.8–10.8)

## 2016-03-09 NOTE — Telephone Encounter (Signed)
Rx has been sent to the pharmacy electronically. ° °

## 2016-03-09 NOTE — Patient Instructions (Signed)
Medication Instructions:  STOP Niacin   Labwork: Your physician recommends that you return for lab work in: [facility], BMET  Testing/Procedures: None Ordered  Follow-Up: Your physician wants you to follow-up in: 12 months with DR HILTY. You will receive a reminder letter in the mail two months in advance. If you don't receive a letter, please call our office to schedule the follow-up appointment.  Any Other Special Instructions Will Be Listed Below (If Applicable).     If you need a refill on your cardiac medications before your next appointment, please call your pharmacy.

## 2016-03-09 NOTE — Progress Notes (Signed)
OFFICE NOTE  Chief Complaint:  Routine follow-up  Primary Care Physician: Rochel Brome, MD  HPI:  Chelsea Phillips is a pleasant 82 year old female who still lives independently. She's been followed in the past by Dr. Rollene Fare prior to his retirement for atrial fibrillation. Her first episode of atrial fibrillation was 7 years ago and she had very little A. fib for which he was symptomatic until she was hospitalized in July of 2013 with A. fib and RVR. She was placed on Cardizem and Lopressor and her thyroid medicine was increased and she was noted to be hypothyroid. She spontaneously converted back to sinus and has been on warfarin. Recently she saw Dr. Rollene Fare and there was some question about possibly coming off of warfarin or switching her to an alternative NOAC.  She wore a monitor in April of this year and did have some atrial fibrillation detected. She denies any chest pain or worsening shortness of breath. She does have a history of some mild hypertension currently only on losartan and metoprolol.   Chelsea Phillips returns today for followup.  She is doing well on Eliquis. She denies any palpitations or recurrent atrial fibrillation. She's had no bleeding complications. She recently had a minor fall and does have some bruising on her right shin but did not hit her head. She generally feels fairly stable and does use a cane for extra support. She reports the swelling has improved with the addition of a diuretic.  I had the pleasure of seeing Chelsea Phillips back in the office today. Overall she seems to be doing fairly well. She unfortunately had one fall which was related to dizziness where she was getting into her car and fell in her car port. She did hit her head on the concrete. She was to get emergency department but did not undergo CT scanning, despite being on Eliquis. Fortunately there was no intracranial bleed. Subsequently she's had no further falls. She's had no bleeding  episodes related to Eliquis. Heart rate seems to be well-controlled and she is in sinus rhythm (or a low atrial rhythm) today.  03/09/2016  Chelsea Phillips was seen today in follow-up. Over the past year she's done fairly well. In fact she's had no bleeding problems on her Eliquis. She denies any worsening shortness of breath or chest pain. In fact there is no atrial fibrillation noted today on her EKG. Blood pressure is well-controlled. She is on low-dose niacin for cholesterol although she would not tolerate increased doses of that due to flushing. I did review the most recent data with her regarding niacin and the fact that it is likely minimally effective for cholesterol. Given her age and the fact this is being used for primary prevention, I advised her to discontinue that today as there is little benefit.  PMHx:  Past Medical History:  Diagnosis Date  . Arthritis   . Diverticulosis of colon (without mention of hemorrhage)   . Esophageal reflux   . Hiatal hernia   . History of atrial fibrillation    with RVR (hopsitalized 12/2011) - converted spontanesouly  . Hypertension   . Hypothyroidism   . Lumbar disc disease   . Stricture and stenosis of esophagus   . Venous insufficiency     Past Surgical History:  Procedure Laterality Date  . ABDOMINAL HYSTERECTOMY    . APPENDECTOMY    . TRANSTHORACIC ECHOCARDIOGRAM  06/19/2012   EF 0000000, grade 1 diastolic dysfunction; mild AV regurg; mild MR, calcified MV annulus;  mild AI; normal PA pressure     FAMHx:  Family History  Problem Relation Age of Onset  . Heart disease Mother   . Leukemia Sister     SOCHx:   reports that she has never smoked. She has never used smokeless tobacco. She reports that she does not drink alcohol or use drugs.  ALLERGIES:  No Known Allergies  ROS: Pertinent items noted in HPI and remainder of comprehensive ROS otherwise negative.  HOME MEDS: Current Outpatient Prescriptions  Medication Sig Dispense  Refill  . ELIQUIS 5 MG TABS tablet TAKE 1 TABLET TWICE A DAY 60 tablet 11  . esomeprazole (NEXIUM) 40 MG capsule Take 1 capsule (40 mg total) by mouth daily before breakfast. 90 capsule 3  . HYDROcodone-acetaminophen (NORCO/VICODIN) 5-325 MG per tablet Take 1 tablet by mouth every 6 (six) hours as needed for pain.    Marland Kitchen levothyroxine (SYNTHROID, LEVOTHROID) 75 MCG tablet Take 75 mcg by mouth daily before breakfast.    . losartan-hydrochlorothiazide (HYZAAR) 50-12.5 MG tablet TAKE 1 TABLET EVERY DAY 30 tablet 0  . metoprolol (LOPRESSOR) 50 MG tablet Take 0.5 tablets (25 mg total) by mouth 2 (two) times daily. 30 tablet 5  . metoprolol (LOPRESSOR) 50 MG tablet Take 0.5 tablets (25 mg total) by mouth 2 (two) times daily. 60 tablet 3  . niacin (NIASPAN) 500 MG CR tablet Take 500 mg by mouth at bedtime.  2  . solifenacin (VESICARE) 5 MG tablet Take 5 mg by mouth daily.     No current facility-administered medications for this visit.     LABS/IMAGING: No results found for this or any previous visit (from the past 48 hour(s)). No results found.  VITALS: BP 106/70   Pulse 63   Ht 5' (1.524 m)   Wt 181 lb (82.1 kg)   BMI 35.35 kg/m   EXAM: General appearance: alert and no distress Lungs: clear to auscultation bilaterally Heart: regular rate and rhythm, S1, S2 normal, no murmur, click, rub or gallop Abdomen: soft, non-tender; bowel sounds normal; no masses,  no organomegaly and obese Extremities: edema trace Pulses: 2+ and symmetric   EKG: Normal sinus rhythm at 63, PRWP anteriorly  ASSESSMENT: 1. Paroxysmal atrial fibrillation - CHADSVASC 4 2. Hypertension-controlled 3. Chronically anticoagulated on Eliquis 4. Leg edema - improved 5. GERD  PLAN: 1.   Chelsea Phillips is tolerating Eliquis without any bleeding problems. She is not had any falls which we are watching closely. Blood pressure is well-controlled. She has some chronic leg edema which is tolerable. She will need repeat  testing of her CBC and metabolic profile and adjustments to her dosing of Eliquis as necessary. I advised her to discontinue niacin today as there is likely little benefit in her age group for primary prevention of coronary disease.  Follow-up annually or sooner as necessary.  Pixie Casino, MD, [facility] Attending Cardiologist [place] 03/09/2016, 11:20 AM

## 2016-03-10 ENCOUNTER — Telehealth: Payer: Self-pay | Admitting: *Deleted

## 2016-03-10 DIAGNOSIS — N289 Disorder of kidney and ureter, unspecified: Secondary | ICD-10-CM

## 2016-03-10 MED ORDER — AMLODIPINE BESYLATE 5 MG PO TABS: 5.0000 mg | ORAL_TABLET | Freq: Every day | ORAL | 12 refills | Status: DC

## 2016-03-10 NOTE — Telephone Encounter (Signed)
-----   Message from Pixie Casino, MD sent at 03/10/2016  5:31 PM EDT ----- Creatinine is worse at 1.6. Would advise discontinuing losartan HCTZ. Replace with amlodipine 5 mg daily for hypertension. Repeat BMET in 2 weeks and arrange for hypertension clinic visit. If Creatinine remains above 1.5, will need reduction in Eliquis dose to 2.5 mg BID.  Dr. Lemmie Evens

## 2016-03-10 NOTE — Telephone Encounter (Signed)
Spoke with pt, she voiced understanding of medication changes. New script sent to the pharmacy. No appointments available with the pharm md in 2 weeks will forward to pharmacist to help with scheduling follow up appointment. Lab orders placed.

## 2016-03-11 NOTE — Telephone Encounter (Signed)
Called patient and verified best date - she can do Oct 4th at 4pm and I will schedule. She was also reminded of repeat labwork and that she can do this same-day as BP visit.

## 2016-03-11 NOTE — Telephone Encounter (Signed)
We can overbook her in one of the following slots -   Oct 3rd 930am or 200pm Oct 4th 400pm Oct 5th 1100am or 400pm  I would call the patient but I cannot overbook our schedule so I figured it may be one less step since you already spoke to her. Let me know if you prefer I call her. Thank you!

## 2016-03-11 NOTE — Telephone Encounter (Signed)
appt scheduled

## 2016-03-19 ENCOUNTER — Other Ambulatory Visit: Payer: Self-pay

## 2016-03-19 MED ORDER — LOSARTAN POTASSIUM-HCTZ 50-12.5 MG PO TABS: 1.0000 | ORAL_TABLET | Freq: Every day | ORAL | 3 refills | Status: DC

## 2016-03-24 ENCOUNTER — Ambulatory Visit (INDEPENDENT_AMBULATORY_CARE_PROVIDER_SITE_OTHER): Payer: Medicare Other | Admitting: Pharmacist

## 2016-03-24 VITALS — BP 112/76 | HR 75

## 2016-03-24 DIAGNOSIS — N289 Disorder of kidney and ureter, unspecified: Secondary | ICD-10-CM | POA: Diagnosis not present

## 2016-03-24 DIAGNOSIS — I10 Essential (primary) hypertension: Secondary | ICD-10-CM | POA: Diagnosis not present

## 2016-03-24 MED ORDER — AMLODIPINE BESYLATE 5 MG PO TABS: 5.0000 mg | ORAL_TABLET | Freq: Every day | ORAL | 12 refills | Status: DC

## 2016-03-24 NOTE — Patient Instructions (Signed)
Return for a a follow up appointment in 3 weeks  Check your blood pressure at home daily (if able) and keep record of the readings.  Take your BP meds as follows: STOP losartan  Start amlodipine 5mg  once a day  Bring all of your meds, your BP cuff and your record of home blood pressures to your next appointment.  Exercise as you're able, try to walk approximately 30 minutes per day.  Keep salt intake to a minimum, especially watch canned and prepared boxed foods.  Eat more fresh fruits and vegetables and fewer canned items.  Avoid eating in fast food restaurants.    HOW TO TAKE YOUR BLOOD PRESSURE: . Rest 5 minutes before taking your blood pressure. .  Don't smoke or drink caffeinated beverages for at least 30 minutes before. . Take your blood pressure before (not after) you eat. . Sit comfortably with your back supported and both feet on the floor (don't cross your legs). . Elevate your arm to heart level on a table or a desk. . Use the proper sized cuff. It should fit smoothly and snugly around your bare upper arm. There should be enough room to slip a fingertip under the cuff. The bottom edge of the cuff should be 1 inch above the crease of the elbow. . Ideally, take 3 measurements at one sitting and record the average.

## 2016-03-24 NOTE — Progress Notes (Signed)
Patient ID: Chelsea Phillips                 DOB: 11-25-1921                      MRN: [id number]     HPI: ACHANTE STURDY is a 82 y.o. female patient of Dr. Debara Pickett with PMH below who presents today for hypertension evaluation. She was recently seen by Dr. Debara Pickett at which time a BMET was obtained. Based on the bump in Scr, she was instructed to stop losartan/HCTZ and start amlodipine.   She presents today and states that the pharmacy reported never receiving amlodipine so she remain on losartan/HCTZ until appt today. She did have BMET drawn just prior to visit today.   She also reports needing to use the restroom frequently with her current medications.   She was unsure if she took combination losartan/HCTZ or just losartan. [place], Ina Kick, called to verify that she does pick up combination pill from her pharmacy.    Cardiac Hx: HTN, PAF  Current HTN meds:  Losartan/HCTZ 50/25mg  daily - though should have been d/ced  BP goal: <150/90  Family History: Mother had heart failure and her brother has hypertension.  Social History: She reports that she used to use snuff but quit about 60 years ago. She denies other tobacco products and alcohol.  Diet: She eats a low sodium diet and most meals are prepared from home. She endorses 1 cup of coffee per day and rarely drinks soda.   Exercise: No formal exercise.    Wt Readings from Last 3 Encounters:  03/09/16 181 lb (82.1 kg)  03/19/15 177 lb 1.6 oz (80.3 kg)  01/27/15 175 lb (79.4 kg)   BP Readings from Last 3 Encounters:  03/24/16 112/76  03/09/16 106/70  03/19/15 128/62   Pulse Readings from Last 3 Encounters:  03/24/16 75  03/09/16 63  03/19/15 64    Renal function: CrCl cannot be calculated (Unknown ideal weight.).  Past Medical History:  Diagnosis Date  . Arthritis   . Diverticulosis of colon (without mention of hemorrhage)   . Esophageal reflux   . Hiatal hernia   . History of atrial  fibrillation    with RVR (hopsitalized 12/2011) - converted spontanesouly  . Hypertension   . Hypothyroidism   . Lumbar disc disease   . Stricture and stenosis of esophagus   . Venous insufficiency     Current Outpatient Prescriptions on File Prior to Visit  Medication Sig Dispense Refill  . ELIQUIS 5 MG TABS tablet TAKE 1 TABLET TWICE A DAY 60 tablet 11  . esomeprazole (NEXIUM) 40 MG capsule Take 1 capsule (40 mg total) by mouth daily before breakfast. 90 capsule 3  . HYDROcodone-acetaminophen (NORCO/VICODIN) 5-325 MG per tablet Take 1 tablet by mouth every 6 (six) hours as needed for pain.    Marland Kitchen levothyroxine (SYNTHROID, LEVOTHROID) 75 MCG tablet Take 75 mcg by mouth daily before breakfast.    . losartan-hydrochlorothiazide (HYZAAR) 50-12.5 MG tablet Take 1 tablet by mouth daily. 90 tablet 3  . metoprolol (LOPRESSOR) 50 MG tablet Take 0.5 tablets (25 mg total) by mouth 2 (two) times daily. 30 tablet 5   No current facility-administered medications on file prior to visit.     No Known Allergies  Blood pressure 112/76, pulse 75.   Assessment/Plan: Hypertension: BP is at goal; however, she was instructed to discontinue losartan/HCTZ due to bump in kidney  function. Will have her stop combination pill and start amlodipine 5mg  daily. She will return for follow up BMET and visit in 2 weeks after medication change.    Thank you, Lelan Pons. Patterson Hammersmith, [place]

## 2016-03-25 ENCOUNTER — Encounter: Payer: Self-pay | Admitting: *Deleted

## 2016-03-25 LAB — BASIC METABOLIC PANEL
BUN: 31 mg/dL — AB (ref 7–25)
CO2: 27 mmol/L (ref 20–31)
Calcium: 9.4 mg/dL (ref 8.6–10.4)
Chloride: 101 mmol/L (ref 98–110)
Creat: 1.55 mg/dL — ABNORMAL HIGH (ref 0.60–0.88)
GLUCOSE: 109 mg/dL — AB (ref 65–99)
Potassium: 4.6 mmol/L (ref 3.5–5.3)
Sodium: 138 mmol/L (ref 135–146)

## 2016-03-26 ENCOUNTER — Encounter: Payer: Self-pay | Admitting: Pharmacist

## 2016-04-04 ENCOUNTER — Other Ambulatory Visit: Payer: Self-pay | Admitting: Internal Medicine

## 2016-04-06 NOTE — Telephone Encounter (Signed)
Pt with recent bump in kidney function due to increase in losartan/HCTZ - this medication has been discontinued at this time.   IF her kidney function does not return to baseline, we will need to decrease her Eliquis dose to 2.5mg  BID (age and SCr).   She has f/u appt schedule next week for labs after stopping losartan and BP check.   She has enough of 5mg  tablets to last her until that appt. She is aware that we will need to decrease dose if no improvement in kidney function after that visit.

## 2016-04-14 ENCOUNTER — Ambulatory Visit (INDEPENDENT_AMBULATORY_CARE_PROVIDER_SITE_OTHER): Payer: Medicare Other | Admitting: Pharmacist

## 2016-04-14 ENCOUNTER — Other Ambulatory Visit: Payer: Self-pay | Admitting: Internal Medicine

## 2016-04-14 ENCOUNTER — Encounter: Payer: Self-pay | Admitting: Pharmacist

## 2016-04-14 ENCOUNTER — Ambulatory Visit (INDEPENDENT_AMBULATORY_CARE_PROVIDER_SITE_OTHER): Payer: Medicare Other

## 2016-04-14 VITALS — BP 146/52 | HR 60

## 2016-04-14 DIAGNOSIS — I10 Essential (primary) hypertension: Secondary | ICD-10-CM

## 2016-04-14 DIAGNOSIS — Z23 Encounter for immunization: Secondary | ICD-10-CM

## 2016-04-14 NOTE — Progress Notes (Signed)
Patient ID: Chelsea Phillips                 DOB: Dec 01, 1921                      MRN: [id number]     HPI: Chelsea Phillips is a 82 y.o. female patient of Dr. Debara Pickett with PMH below who presents today for hypertension follow up. She was recently seen by Dr. Debara Pickett at which time a BMET was obtained. Due to an increase in her SCr, she was instructed to stop losartan/HCTZ and to start amlodipine. At her last visit to the HTN clinic, pt had not discontinued losartan/hctz nor started amlodipine.  Today, pt reports that she made this medication change 3 weeks ago after our visit. She reports she is tolerating the medication well. She denies any symptoms of low BP (dizziness, light headedness) despite low diastolic reading today. She was also inquiring about if her dose of Eliquis needed to be decreased (as previously discussed over the phone).  Cardiac Hx: HTN, PAF  Current HTN meds:  Amlodipine 5 mg daily Metoprolol 50 mg BID  Previously tried:  Losartan/HCTZ 50/25mg  daily - increased SCr  BP goal: <150/90  Family History: Mother had heart failure and her brother has hypertension.  Social History: She reports that she used to use snuff but quit about 60 years ago. She denies other tobacco products and alcohol.  Diet: She eats a low sodium diet and most meals are prepared from home. She endorses 1 cup of coffee per day and rarely drinks soda.   Exercise: No formal exercise.   Home BP readings: 106/71, 97/59, 123/69, 132/68. Pt uses a wrist cuff that is approx. 80 year old.  Wt Readings from Last 3 Encounters:  03/09/16 181 lb (82.1 kg)  03/19/15 177 lb 1.6 oz (80.3 kg)  01/27/15 175 lb (79.4 kg)   BP Readings from Last 3 Encounters:  04/14/16 (!) 146/52  03/24/16 112/76  03/09/16 106/70   Pulse Readings from Last 3 Encounters:  04/14/16 60  03/24/16 75  03/09/16 63    Renal function: CrCl cannot be calculated (Unknown ideal weight.).  Past Medical History:    Diagnosis Date  . Arthritis   . Diverticulosis of colon (without mention of hemorrhage)   . Esophageal reflux   . Hiatal hernia   . History of atrial fibrillation    with RVR (hopsitalized 12/2011) - converted spontanesouly  . Hypertension   . Hypothyroidism   . Lumbar disc disease   . Stricture and stenosis of esophagus   . Venous insufficiency     Current Outpatient Prescriptions on File Prior to Visit  Medication Sig Dispense Refill  . amLODipine (NORVASC) 5 MG tablet Take 1 tablet (5 mg total) by mouth daily. 30 tablet 12  . ELIQUIS 5 MG TABS tablet TAKE 1 TABLET TWICE A DAY 60 tablet 11  . esomeprazole (NEXIUM) 40 MG capsule Take 1 capsule (40 mg total) by mouth daily before breakfast. 90 capsule 3  . HYDROcodone-acetaminophen (NORCO/VICODIN) 5-325 MG per tablet Take 1 tablet by mouth every 6 (six) hours as needed for pain.    Marland Kitchen levothyroxine (SYNTHROID, LEVOTHROID) 75 MCG tablet Take 75 mcg by mouth daily before breakfast.    . metoprolol (LOPRESSOR) 50 MG tablet Take 0.5 tablets (25 mg total) by mouth 2 (two) times daily. 30 tablet 5  . Omega-3 Fatty Acids (OMEGA-3 1450 PO) Take 1 capsule by mouth daily.  No current facility-administered medications on file prior to visit.     No Known Allergies  Blood pressure (!) 146/52, pulse 60, SpO2 92 %.   Assessment/Plan: Hypertension: BP today in clinic is controlled. Will continue amlodipine and metoprolol as currently prescribed. BMET today to determine kidney function after discontinuation of losartan/hctz before making Eliquis dose reduction. Will follow up with Dr. Debara Pickett as scheduled and with HTN clinic as needed.    Thank you, Lelan Pons. Patterson Hammersmith, [facility]  04/14/2016 2:45 PM

## 2016-04-14 NOTE — Patient Instructions (Signed)
Return for  a follow up appointment as scheduled with Dr. Debara Pickett  Check your blood pressure at home daily (if able) and keep record of the readings.  Take your BP meds as follows: Take medications as prescribed   Bring all of your meds, your BP cuff and your record of home blood pressures to your next appointment.  Exercise as you're able, try to walk approximately 30 minutes per day.  Keep salt intake to a minimum, especially watch canned and prepared boxed foods.  Eat more fresh fruits and vegetables and fewer canned items.  Avoid eating in fast food restaurants.    HOW TO TAKE YOUR BLOOD PRESSURE: . Rest 5 minutes before taking your blood pressure. .  Don't smoke or drink caffeinated beverages for at least 30 minutes before. . Take your blood pressure before (not after) you eat. . Sit comfortably with your back supported and both feet on the floor (don't cross your legs). . Elevate your arm to heart level on a table or a desk. . Use the proper sized cuff. It should fit smoothly and snugly around your bare upper arm. There should be enough room to slip a fingertip under the cuff. The bottom edge of the cuff should be 1 inch above the crease of the elbow. . Ideally, take 3 measurements at one sitting and record the average.

## 2016-04-15 LAB — BASIC METABOLIC PANEL
BUN: 26 mg/dL — AB (ref 7–25)
CALCIUM: 9.2 mg/dL (ref 8.6–10.4)
CO2: 27 mmol/L (ref 20–31)
CREATININE: 1.29 mg/dL — AB (ref 0.60–0.88)
Chloride: 102 mmol/L (ref 98–110)
Glucose, Bld: 84 mg/dL (ref 65–99)
Potassium: 4.3 mmol/L (ref 3.5–5.3)
Sodium: 140 mmol/L (ref 135–146)

## 2016-04-15 NOTE — Telephone Encounter (Signed)
Spoke to patient - notified that labs improved. Will continue 5mg  BID.

## 2016-06-10 DIAGNOSIS — E782 Mixed hyperlipidemia: Secondary | ICD-10-CM | POA: Diagnosis not present

## 2016-06-10 DIAGNOSIS — R7301 Impaired fasting glucose: Secondary | ICD-10-CM | POA: Diagnosis not present

## 2016-06-10 DIAGNOSIS — I482 Chronic atrial fibrillation: Secondary | ICD-10-CM | POA: Diagnosis not present

## 2016-06-10 DIAGNOSIS — I10 Essential (primary) hypertension: Secondary | ICD-10-CM | POA: Diagnosis not present

## 2016-06-10 DIAGNOSIS — E038 Other specified hypothyroidism: Secondary | ICD-10-CM | POA: Diagnosis not present

## 2016-07-23 DIAGNOSIS — L301 Dyshidrosis [pompholyx]: Secondary | ICD-10-CM | POA: Diagnosis not present

## 2016-07-23 DIAGNOSIS — L300 Nummular dermatitis: Secondary | ICD-10-CM | POA: Diagnosis not present

## 2016-07-23 DIAGNOSIS — L578 Other skin changes due to chronic exposure to nonionizing radiation: Secondary | ICD-10-CM | POA: Diagnosis not present

## 2016-09-13 DIAGNOSIS — E038 Other specified hypothyroidism: Secondary | ICD-10-CM | POA: Diagnosis not present

## 2016-09-13 DIAGNOSIS — I482 Chronic atrial fibrillation: Secondary | ICD-10-CM | POA: Diagnosis not present

## 2016-09-13 DIAGNOSIS — R7301 Impaired fasting glucose: Secondary | ICD-10-CM | POA: Diagnosis not present

## 2016-09-13 DIAGNOSIS — E782 Mixed hyperlipidemia: Secondary | ICD-10-CM | POA: Diagnosis not present

## 2016-09-13 DIAGNOSIS — I10 Essential (primary) hypertension: Secondary | ICD-10-CM | POA: Diagnosis not present

## 2016-09-13 DIAGNOSIS — Z0189 Encounter for other specified special examinations: Secondary | ICD-10-CM | POA: Diagnosis not present

## 2016-09-16 IMAGING — DX DG ABDOMEN ACUTE W/ 1V CHEST
3 series · 3 of 3 positions shown · non-contrast
Comparison: None

CLINICAL DATA: Indigestion, nausea

EXAM:
DG ABDOMEN ACUTE W/ 1V CHEST

[chest pa]
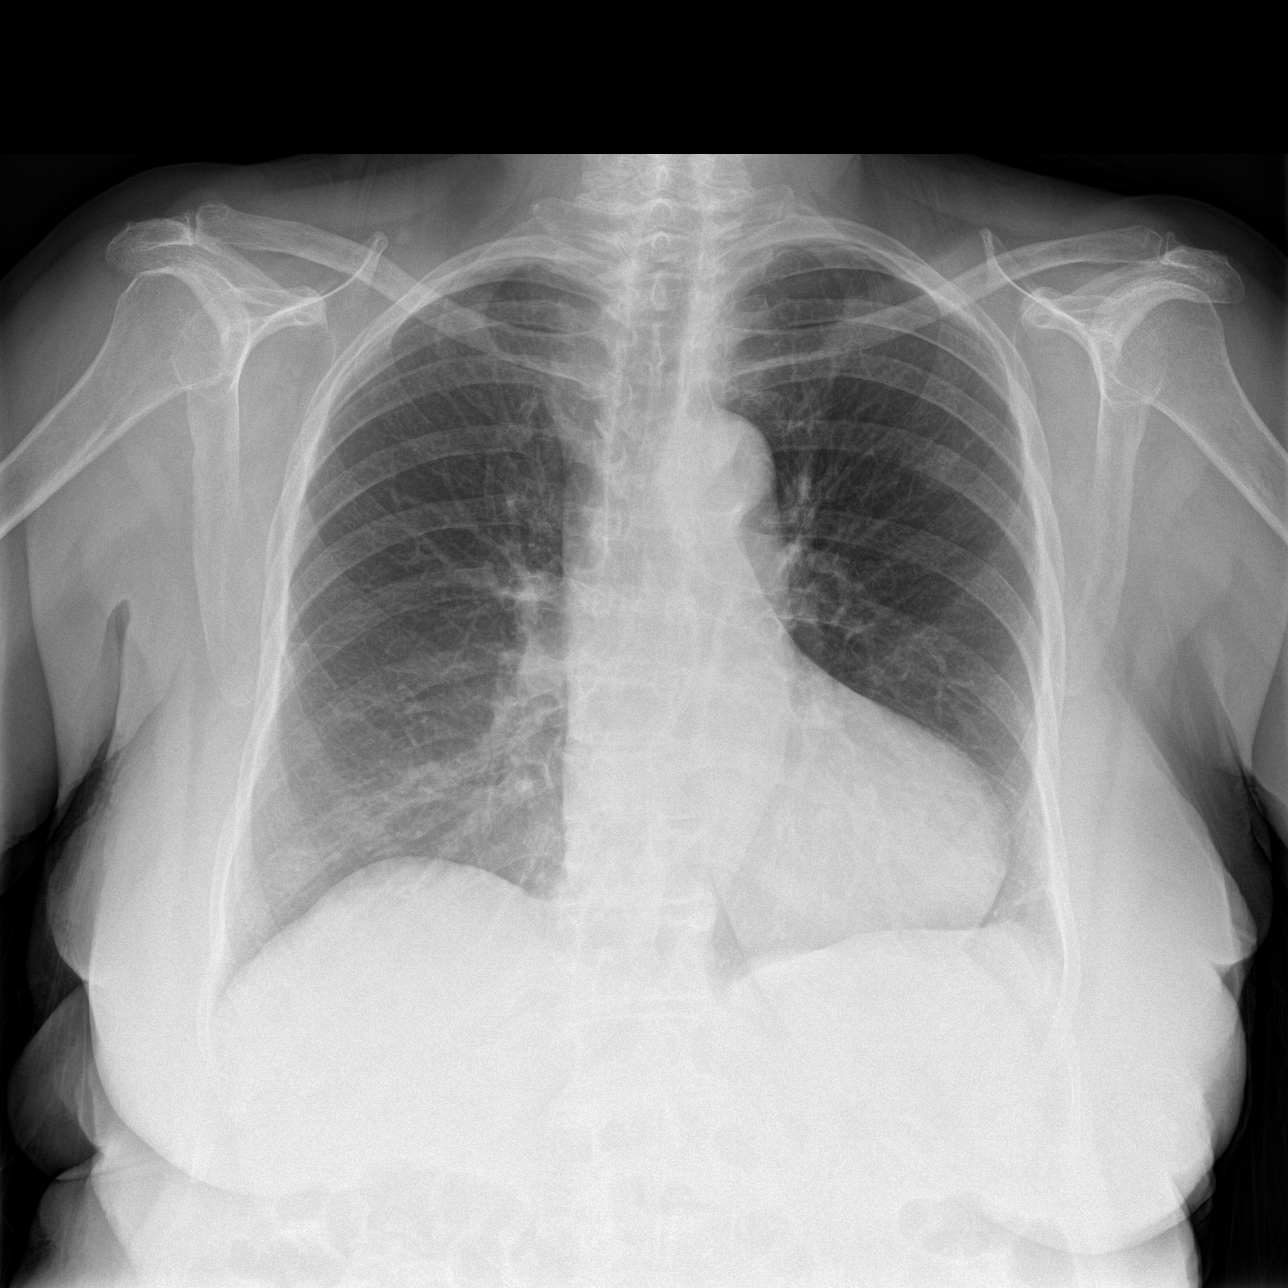

[abdomen erect]
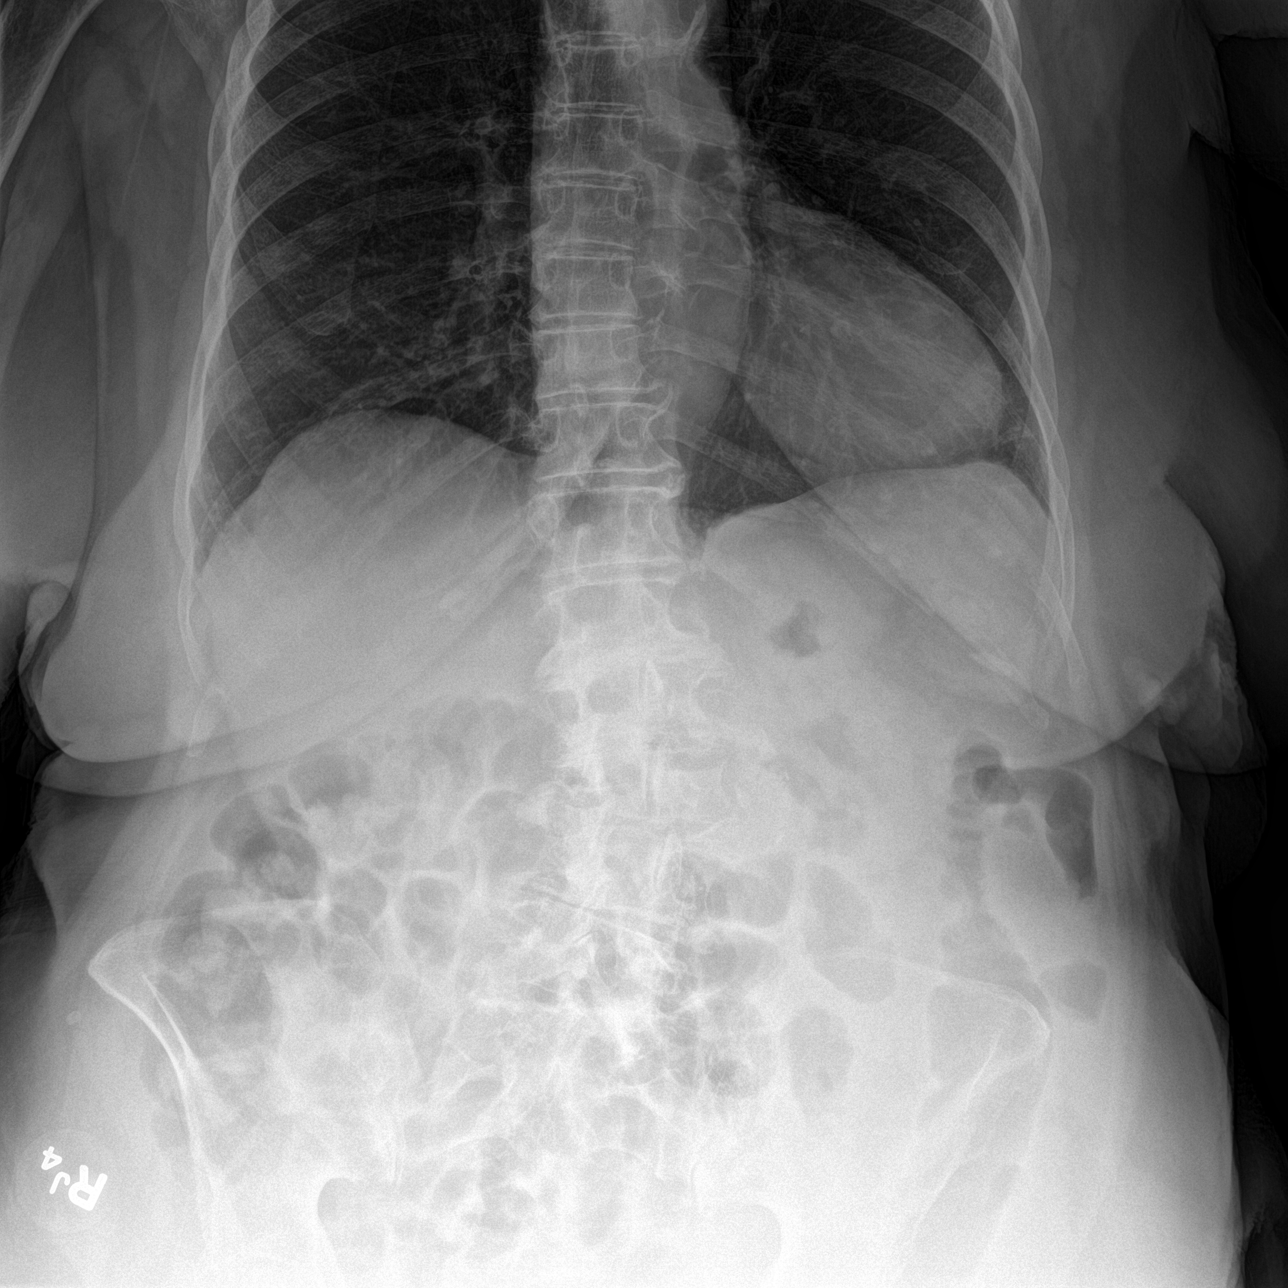

[abdomen supine]
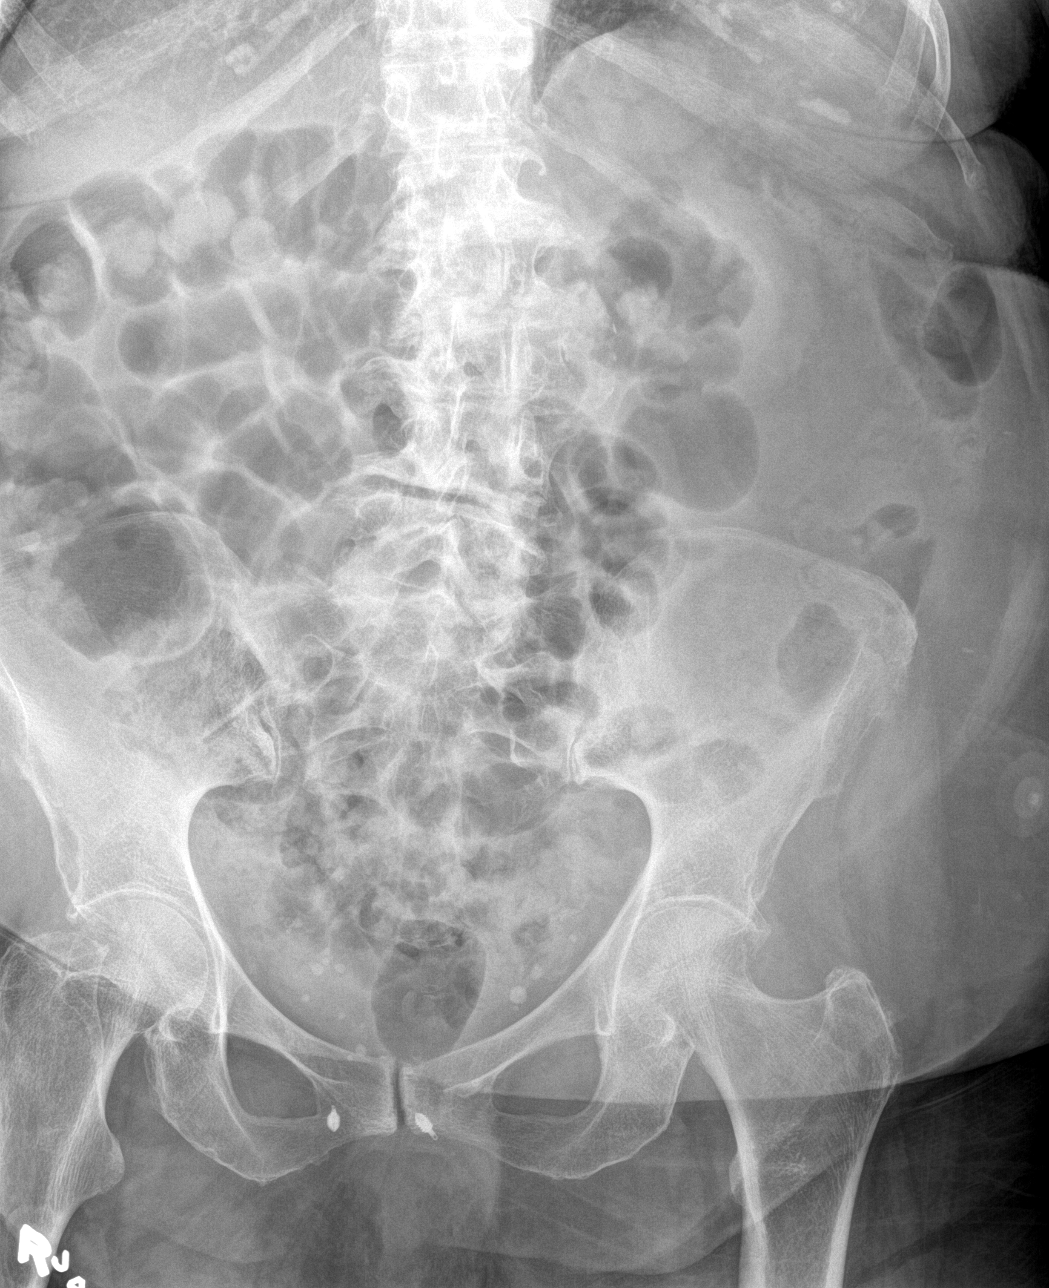

[3 of 3 positions shown; findings below may reference images not displayed]

FINDINGS: Lungs are clear.  No pleural effusion or pneumothorax.

The heart is normal in size.

Nonobstructive bowel gas pattern.

No evidence of free air under the diaphragm on the upright view.

Degenerative changes of the visualized thoracolumbar spine.
IMPRESSION: No evidence of acute cardiopulmonary disease.

No evidence of small bowel obstruction or free air.

## 2016-10-20 ENCOUNTER — Other Ambulatory Visit: Payer: Self-pay | Admitting: Internal Medicine

## 2016-12-01 DIAGNOSIS — L821 Other seborrheic keratosis: Secondary | ICD-10-CM | POA: Diagnosis not present

## 2016-12-01 DIAGNOSIS — C44529 Squamous cell carcinoma of skin of other part of trunk: Secondary | ICD-10-CM | POA: Diagnosis not present

## 2016-12-17 DIAGNOSIS — M1712 Unilateral primary osteoarthritis, left knee: Secondary | ICD-10-CM | POA: Diagnosis not present

## 2016-12-17 DIAGNOSIS — I482 Chronic atrial fibrillation: Secondary | ICD-10-CM | POA: Diagnosis not present

## 2016-12-17 DIAGNOSIS — I10 Essential (primary) hypertension: Secondary | ICD-10-CM | POA: Diagnosis not present

## 2016-12-17 DIAGNOSIS — E038 Other specified hypothyroidism: Secondary | ICD-10-CM | POA: Diagnosis not present

## 2016-12-17 DIAGNOSIS — E782 Mixed hyperlipidemia: Secondary | ICD-10-CM | POA: Diagnosis not present

## 2017-02-22 ENCOUNTER — Ambulatory Visit (INDEPENDENT_AMBULATORY_CARE_PROVIDER_SITE_OTHER): Payer: Medicare Other | Admitting: Internal Medicine

## 2017-02-22 ENCOUNTER — Encounter: Payer: Self-pay | Admitting: Internal Medicine

## 2017-02-22 VITALS — BP 144/78 | HR 66 | Ht 60.0 in | Wt 176.0 lb

## 2017-02-22 DIAGNOSIS — I480 Paroxysmal atrial fibrillation: Secondary | ICD-10-CM

## 2017-02-22 DIAGNOSIS — Z7901 Long term (current) use of anticoagulants: Secondary | ICD-10-CM

## 2017-02-22 DIAGNOSIS — I10 Essential (primary) hypertension: Secondary | ICD-10-CM

## 2017-02-22 NOTE — Progress Notes (Signed)
OFFICE NOTE  Chief Complaint:  No complaints  Primary Care Physician: Rochel Brome, MD  HPI:  Chelsea Phillips is a pleasant 83 year old female who still lives independently. She's been followed in the past by Dr. Rollene Fare prior to his retirement for atrial fibrillation. Her first episode of atrial fibrillation was 7 years ago and she had very little A. fib for which he was symptomatic until she was hospitalized in July of 2013 with A. fib and RVR. She was placed on Cardizem and Lopressor and her thyroid medicine was increased and she was noted to be hypothyroid. She spontaneously converted back to sinus and has been on warfarin. Recently she saw Dr. Rollene Fare and there was some question about possibly coming off of warfarin or switching her to an alternative NOAC.  She wore a monitor in April of this year and did have some atrial fibrillation detected. She denies any chest pain or worsening shortness of breath. She does have a history of some mild hypertension currently only on losartan and metoprolol.   Chelsea Phillips returns today for followup.  She is doing well on Eliquis. She denies any palpitations or recurrent atrial fibrillation. She's had no bleeding complications. She recently had a minor fall and does have some bruising on her right shin but did not hit her head. She generally feels fairly stable and does use a cane for extra support. She reports the swelling has improved with the addition of a diuretic.  I had the pleasure of seeing Chelsea Phillips back in the office today. Overall she seems to be doing fairly well. She unfortunately had one fall which was related to dizziness where she was getting into her car and fell in her car port. She did hit her head on the concrete. She was to get emergency department but did not undergo CT scanning, despite being on Eliquis. Fortunately there was no intracranial bleed. Subsequently she's had no further falls. She's had no bleeding  episodes related to Eliquis. Heart rate seems to be well-controlled and she is in sinus rhythm (or a low atrial rhythm) today.  03/09/2016  Chelsea Phillips was seen today in follow-up. Over the past year she's done fairly well. In fact she's had no bleeding problems on her Eliquis. She denies any worsening shortness of breath or chest pain. In fact there is no atrial fibrillation noted today on her EKG. Blood pressure is well-controlled. She is on low-dose niacin for cholesterol although she would not tolerate increased doses of that due to flushing. I did review the most recent data with her regarding niacin and the fact that it is likely minimally effective for cholesterol. Given her age and the fact this is being used for primary prevention, I advised her to discontinue that today as there is little benefit.  02/22/2017  Chelsea Phillips was seen today in follow-up. Her only issue is some ongoing increased urination at night. She seen urology and been on a number of medications with no benefit. She denies any recurrent A. fib. She has no bleeding problems on Eliquis. Her blood pressures monitored at home and shows very good control. She is on some thyroid medication monitor by her primary. She takes low-dose amlodipine as well as metoprolol for blood pressure. She denies chest pain or worsening shortness of breath.  PMHx:  Past Medical History:  Diagnosis Date  . Arthritis   . Diverticulosis of colon (without mention of hemorrhage)   . Esophageal reflux   . Hiatal  hernia   . History of atrial fibrillation    with RVR (hopsitalized 12/2011) - converted spontanesouly  . Hypertension   . Hypothyroidism   . Lumbar disc disease   . Stricture and stenosis of esophagus   . Venous insufficiency     Past Surgical History:  Procedure Laterality Date  . ABDOMINAL HYSTERECTOMY    . APPENDECTOMY    . TRANSTHORACIC ECHOCARDIOGRAM  06/19/2012   EF 09-98%, grade 1 diastolic dysfunction; mild AV regurg;  mild MR, calcified MV annulus; mild AI; normal PA pressure     FAMHx:  Family History  Problem Relation Age of Onset  . Heart disease Mother   . Leukemia Sister     SOCHx:   reports that she has never smoked. She has never used smokeless tobacco. She reports that she does not drink alcohol or use drugs.  ALLERGIES:  No Known Allergies  ROS: Pertinent items noted in HPI and remainder of comprehensive ROS otherwise negative.  HOME MEDS: Current Outpatient Prescriptions  Medication Sig Dispense Refill  . amLODipine (NORVASC) 5 MG tablet Take 5 mg by mouth daily.    Marland Kitchen ELIQUIS 5 MG TABS tablet TAKE 1 TABLET BY MOUTH TWICE DAILY 180 tablet 1  . HYDROcodone-acetaminophen (NORCO/VICODIN) 5-325 MG per tablet Take 1 tablet by mouth every 6 (six) hours as needed for pain.    Marland Kitchen levothyroxine (SYNTHROID, LEVOTHROID) 75 MCG tablet Take 75 mcg by mouth daily before breakfast.    . metoprolol (LOPRESSOR) 50 MG tablet Take 0.5 tablets (25 mg total) by mouth 2 (two) times daily. 30 tablet 5  . Omega-3 Fatty Acids (OMEGA-3 1450 PO) Take 1 capsule by mouth daily.     No current facility-administered medications for this visit.     LABS/IMAGING: No results found for this or any previous visit (from the past 48 hour(s)). No results found.  VITALS: BP (!) 144/78   Pulse 66   Ht 5' (1.524 m)   Wt 176 lb (79.8 kg)   BMI 34.37 kg/m   EXAM: General appearance: alert and no distress Neck: no carotid bruit, no JVD and thyroid not enlarged, symmetric, no tenderness/mass/nodules Lungs: clear to auscultation bilaterally Heart: regular rate and rhythm, S1, S2 normal, no murmur, click, rub or gallop Abdomen: soft, non-tender; bowel sounds normal; no masses,  no organomegaly Extremities: extremities normal, atraumatic, no cyanosis or edema Pulses: 2+ and symmetric Skin: Skin color, texture, turgor normal. No rashes or lesions Neurologic: Grossly normal Psych: Pleasant  EKG: Normal sinus rhythm  at 66-personally reviewed  ASSESSMENT: 1. Paroxysmal atrial fibrillation - CHADSVASC 4 on Eliquis 2. Hypertension 3. Leg edema - improved 4. Hypothyroidism 5. GERD  PLAN: 1.   Chelsea Phillips is doing well with PAF and is in sinus rhythm today. She is appropriately anticoagulant on Eliquis for CHADSVASC score 4. She has hypertension which is well-controlled. She has hypothyroidism which is at goal. No new cardiac complaints.  Follow-up annually or sooner as necessary.  Pixie Casino, MD, [facility] Attending Cardiologist [place] 02/22/2017, 11:10 AM

## 2017-02-22 NOTE — Patient Instructions (Signed)
Your physician wants you to follow-up in: ONE YEAR with Dr. Hilty. You will receive a reminder letter in the mail two months in advance. If you don't receive a letter, please call our office to schedule the follow-up appointment.  

## 2017-03-03 ENCOUNTER — Other Ambulatory Visit: Payer: Self-pay | Admitting: Internal Medicine

## 2017-03-03 NOTE — Telephone Encounter (Signed)
Rx(s) sent to pharmacy electronically.  

## 2017-03-18 DIAGNOSIS — L259 Unspecified contact dermatitis, unspecified cause: Secondary | ICD-10-CM | POA: Diagnosis not present

## 2017-04-12 ENCOUNTER — Encounter: Payer: Self-pay | Admitting: Podiatry

## 2017-04-12 ENCOUNTER — Ambulatory Visit (INDEPENDENT_AMBULATORY_CARE_PROVIDER_SITE_OTHER): Payer: Medicare Other | Admitting: Podiatry

## 2017-04-12 VITALS — BP 128/61 | HR 65 | Ht 60.0 in | Wt 175.0 lb

## 2017-04-12 DIAGNOSIS — Q6652 Congenital pes planus, left foot: Secondary | ICD-10-CM

## 2017-04-12 DIAGNOSIS — M76829 Posterior tibial tendinitis, unspecified leg: Secondary | ICD-10-CM | POA: Diagnosis not present

## 2017-04-12 DIAGNOSIS — M21072 Valgus deformity, not elsewhere classified, left ankle: Secondary | ICD-10-CM | POA: Diagnosis not present

## 2017-04-12 NOTE — Progress Notes (Signed)
   Subjective:    Patient ID: Chelsea Phillips, female    DOB: [birth year], 83 y.o.   MRN: [id number] Chief Complaint  Patient presents with  . Difficulty Walking    wants to be evaluated for new brace    HPI  83 y.o. female presents with the above complaint. Has history of severe flatfoot to the left lower extremity for which she wears an [place] brace.  The brace is 81 years old to the day and is in need of a new one.  States she is not able to ambulate without the braces. Past Medical History:  Diagnosis Date  . Arthritis   . Diverticulosis of colon (without mention of hemorrhage)   . Esophageal reflux   . Hiatal hernia   . History of atrial fibrillation    with RVR (hopsitalized 12/2011) - converted spontanesouly  . Hypertension   . Hypothyroidism   . Lumbar disc disease   . Stricture and stenosis of esophagus   . Venous insufficiency    Past Surgical History:  Procedure Laterality Date  . ABDOMINAL HYSTERECTOMY    . APPENDECTOMY    . TRANSTHORACIC ECHOCARDIOGRAM  06/19/2012   EF 40-98%, grade 1 diastolic dysfunction; mild AV regurg; mild MR, calcified MV annulus; mild AI; normal PA pressure     Current Outpatient Prescriptions:  .  amLODipine (NORVASC) 5 MG tablet, Take 1 tablet (5 mg total) by mouth daily., Disp: 30 tablet, Rfl: 5 .  ELIQUIS 5 MG TABS tablet, TAKE 1 TABLET BY MOUTH TWICE DAILY, Disp: 180 tablet, Rfl: 1 .  HYDROcodone-acetaminophen (NORCO/VICODIN) 5-325 MG per tablet, Take 1 tablet by mouth every 6 (six) hours as needed for pain., Disp: , Rfl:  .  levothyroxine (SYNTHROID, LEVOTHROID) 75 MCG tablet, Take 75 mcg by mouth daily before breakfast., Disp: , Rfl:  .  metoprolol (LOPRESSOR) 50 MG tablet, Take 0.5 tablets (25 mg total) by mouth 2 (two) times daily., Disp: 30 tablet, Rfl: 5 .  Omega-3 Fatty Acids (OMEGA-3 1450 PO), Take 1 capsule by mouth daily., Disp: , Rfl:   No Known Allergies  Review of Systems  Constitutional: Negative.   HENT: Positive  for hearing loss and postnasal drip.   Eyes: Negative.   Respiratory: Negative.   Cardiovascular: Negative.   Gastrointestinal: Negative.   Endocrine: Negative.   Genitourinary: Negative.   Musculoskeletal: Positive for gait problem.  Allergic/Immunologic: Negative.   Hematological: Negative.   Psychiatric/Behavioral: Negative.       Objective:   Physical Exam Vitals:   04/12/17 1037  BP: 128/61  Pulse: 65   General AA&O x3. Normal mood and affect.  Vascular Dorsalis pedis and posterior tibial pulses  present 2+ bilaterally  Capillary refill normal to all digits. Pedal hair growth normal.  Neurologic Epicritic sensation grossly present.  Dermatologic No open lesions. Interspaces clear of maceration. Nails well groomed and normal in appearance.  Orthopedic: MMT 5/5 in dorsiflexion, plantarflexion, inversion, and eversion. Normal joint ROM without pain or crepitus. Severe pes planus left foot with ankle valgus.  Prominent navicular tuberosity.  Severe forefoot abduction.      Assessment & Plan:  Patient was evaluated and treated and all questions answered  Pes planus, ankle valgus 2/2 posterior tibial tendon dysfunction -Will make appointment for fabrication of new AFO brace -Advised to continue using current brace  Follow-up in 4 weeks

## 2017-04-15 ENCOUNTER — Other Ambulatory Visit: Payer: Self-pay | Admitting: Internal Medicine

## 2017-04-21 ENCOUNTER — Telehealth: Payer: Self-pay | Admitting: Internal Medicine

## 2017-04-21 NOTE — Telephone Encounter (Signed)
Faxed signed med red clarification request to [facility] regarding dx of obesity

## 2017-05-05 ENCOUNTER — Other Ambulatory Visit: Payer: Medicare Other | Admitting: *Deleted

## 2017-06-09 ENCOUNTER — Ambulatory Visit (INDEPENDENT_AMBULATORY_CARE_PROVIDER_SITE_OTHER): Payer: Medicare Other | Admitting: *Deleted

## 2017-06-09 DIAGNOSIS — R269 Unspecified abnormalities of gait and mobility: Secondary | ICD-10-CM

## 2017-06-09 DIAGNOSIS — M21072 Valgus deformity, not elsewhere classified, left ankle: Secondary | ICD-10-CM | POA: Diagnosis not present

## 2017-06-09 DIAGNOSIS — M76829 Posterior tibial tendinitis, unspecified leg: Secondary | ICD-10-CM | POA: Diagnosis not present

## 2017-06-09 DIAGNOSIS — M659 Synovitis and tenosynovitis, unspecified: Secondary | ICD-10-CM | POA: Diagnosis not present

## 2017-06-20 NOTE — Progress Notes (Signed)
Patient ID: Chelsea Phillips, female   DOB: [birth year], 83 y.o.   MRN: [id number]   Patient presents for fitting of [place] with [facility] Certified Pedorthist. Written and verbal break in instructions given. Patient will follow up in 6 weeks with Dr. March Rummage

## 2017-06-24 NOTE — Progress Notes (Signed)
Agree with CMA note -Dr. Cannon Kettle

## 2017-06-27 ENCOUNTER — Other Ambulatory Visit: Payer: Self-pay

## 2017-06-27 MED ORDER — AMLODIPINE BESYLATE 5 MG PO TABS: 5.0000 mg | ORAL_TABLET | Freq: Every day | ORAL | 2 refills | Status: DC

## 2017-06-27 NOTE — Telephone Encounter (Signed)
Rx(s) sent to pharmacy electronically.  

## 2017-08-18 DIAGNOSIS — M25511 Pain in right shoulder: Secondary | ICD-10-CM | POA: Diagnosis not present

## 2017-10-10 DIAGNOSIS — H26493 Other secondary cataract, bilateral: Secondary | ICD-10-CM | POA: Diagnosis not present

## 2017-10-10 DIAGNOSIS — H04123 Dry eye syndrome of bilateral lacrimal glands: Secondary | ICD-10-CM | POA: Diagnosis not present

## 2017-10-12 ENCOUNTER — Other Ambulatory Visit: Payer: Self-pay | Admitting: Internal Medicine

## 2017-10-12 DIAGNOSIS — I482 Chronic atrial fibrillation: Secondary | ICD-10-CM | POA: Diagnosis not present

## 2017-10-12 DIAGNOSIS — M1712 Unilateral primary osteoarthritis, left knee: Secondary | ICD-10-CM | POA: Diagnosis not present

## 2017-10-12 DIAGNOSIS — I10 Essential (primary) hypertension: Secondary | ICD-10-CM | POA: Diagnosis not present

## 2017-10-12 DIAGNOSIS — E782 Mixed hyperlipidemia: Secondary | ICD-10-CM | POA: Diagnosis not present

## 2017-10-12 DIAGNOSIS — E038 Other specified hypothyroidism: Secondary | ICD-10-CM | POA: Diagnosis not present

## 2017-10-18 DIAGNOSIS — M545 Low back pain: Secondary | ICD-10-CM | POA: Diagnosis not present

## 2017-10-18 DIAGNOSIS — M25532 Pain in left wrist: Secondary | ICD-10-CM | POA: Diagnosis not present

## 2017-10-18 DIAGNOSIS — S60212A Contusion of left wrist, initial encounter: Secondary | ICD-10-CM | POA: Diagnosis not present

## 2018-01-19 DIAGNOSIS — E038 Other specified hypothyroidism: Secondary | ICD-10-CM | POA: Diagnosis not present

## 2018-01-19 DIAGNOSIS — E782 Mixed hyperlipidemia: Secondary | ICD-10-CM | POA: Diagnosis not present

## 2018-01-19 DIAGNOSIS — I10 Essential (primary) hypertension: Secondary | ICD-10-CM | POA: Diagnosis not present

## 2018-01-19 DIAGNOSIS — I482 Chronic atrial fibrillation: Secondary | ICD-10-CM | POA: Diagnosis not present

## 2018-03-17 ENCOUNTER — Other Ambulatory Visit: Payer: Self-pay | Admitting: Internal Medicine

## 2018-06-26 DIAGNOSIS — M150 Primary generalized (osteo)arthritis: Secondary | ICD-10-CM | POA: Diagnosis not present

## 2018-06-26 DIAGNOSIS — M810 Age-related osteoporosis without current pathological fracture: Secondary | ICD-10-CM | POA: Diagnosis not present

## 2018-06-26 DIAGNOSIS — I4811 Longstanding persistent atrial fibrillation: Secondary | ICD-10-CM | POA: Diagnosis not present

## 2018-06-26 DIAGNOSIS — E782 Mixed hyperlipidemia: Secondary | ICD-10-CM | POA: Diagnosis not present

## 2018-06-26 DIAGNOSIS — I10 Essential (primary) hypertension: Secondary | ICD-10-CM | POA: Diagnosis not present

## 2018-06-26 DIAGNOSIS — R413 Other amnesia: Secondary | ICD-10-CM | POA: Diagnosis not present

## 2018-06-26 DIAGNOSIS — Z0001 Encounter for general adult medical examination with abnormal findings: Secondary | ICD-10-CM | POA: Diagnosis not present

## 2018-07-03 ENCOUNTER — Other Ambulatory Visit: Payer: Self-pay | Admitting: Internal Medicine

## 2018-07-03 NOTE — Telephone Encounter (Signed)
Per [facility] Scr=1.2

## 2018-07-19 DIAGNOSIS — H01005 Unspecified blepharitis left lower eyelid: Secondary | ICD-10-CM | POA: Diagnosis not present

## 2018-07-19 DIAGNOSIS — H01002 Unspecified blepharitis right lower eyelid: Secondary | ICD-10-CM | POA: Diagnosis not present

## 2018-07-19 DIAGNOSIS — H04123 Dry eye syndrome of bilateral lacrimal glands: Secondary | ICD-10-CM | POA: Diagnosis not present

## 2018-12-07 ENCOUNTER — Other Ambulatory Visit: Payer: Self-pay | Admitting: Internal Medicine

## 2018-12-24 ENCOUNTER — Other Ambulatory Visit: Payer: Self-pay | Admitting: Internal Medicine

## 2018-12-25 ENCOUNTER — Other Ambulatory Visit: Payer: Self-pay

## 2018-12-25 DIAGNOSIS — I480 Paroxysmal atrial fibrillation: Secondary | ICD-10-CM

## 2018-12-25 NOTE — Telephone Encounter (Signed)
I transferred to scheduling and placed an order for cmp

## 2018-12-25 NOTE — Telephone Encounter (Signed)
Pt is a 89 yof requesting eliquis 5mg , wt 79.8kg, scr outdated (will need updated), lov 2018(needs appt)... Called pt to let them know the dtr stated that they would be compliant to come for an appt for labs and dr hilty I stressed that the pt is at the borderline point of possibly having a dose change and that checking is very important

## 2019-01-02 DIAGNOSIS — E782 Mixed hyperlipidemia: Secondary | ICD-10-CM | POA: Diagnosis not present

## 2019-01-02 DIAGNOSIS — I4811 Longstanding persistent atrial fibrillation: Secondary | ICD-10-CM | POA: Diagnosis not present

## 2019-01-02 DIAGNOSIS — I10 Essential (primary) hypertension: Secondary | ICD-10-CM | POA: Diagnosis not present

## 2019-01-02 DIAGNOSIS — M150 Primary generalized (osteo)arthritis: Secondary | ICD-10-CM | POA: Diagnosis not present

## 2019-01-02 DIAGNOSIS — R7301 Impaired fasting glucose: Secondary | ICD-10-CM | POA: Diagnosis not present

## 2019-01-10 ENCOUNTER — Other Ambulatory Visit: Payer: Self-pay | Admitting: Internal Medicine

## 2019-01-15 ENCOUNTER — Telehealth: Payer: Self-pay | Admitting: Internal Medicine

## 2019-01-15 ENCOUNTER — Other Ambulatory Visit: Payer: Self-pay

## 2019-01-15 ENCOUNTER — Ambulatory Visit (INDEPENDENT_AMBULATORY_CARE_PROVIDER_SITE_OTHER): Payer: Medicare Other | Admitting: Internal Medicine

## 2019-01-15 ENCOUNTER — Encounter: Payer: Self-pay | Admitting: Internal Medicine

## 2019-01-15 VITALS — BP 117/70 | HR 104 | Ht 60.0 in | Wt 168.0 lb

## 2019-01-15 DIAGNOSIS — I480 Paroxysmal atrial fibrillation: Secondary | ICD-10-CM | POA: Diagnosis not present

## 2019-01-15 DIAGNOSIS — R7989 Other specified abnormal findings of blood chemistry: Secondary | ICD-10-CM

## 2019-01-15 DIAGNOSIS — Z7901 Long term (current) use of anticoagulants: Secondary | ICD-10-CM | POA: Diagnosis not present

## 2019-01-15 DIAGNOSIS — I10 Essential (primary) hypertension: Secondary | ICD-10-CM | POA: Diagnosis not present

## 2019-01-15 MED ORDER — APIXABAN 2.5 MG PO TABS: 2.5000 mg | ORAL_TABLET | Freq: Two times a day (BID) | ORAL | 3 refills | Status: DC

## 2019-01-15 NOTE — Patient Instructions (Signed)
Medication Instructions:  DECREASE the Eliquis to 2.5 mg twice daily  If you need a refill on your cardiac medications before your next appointment, please call your pharmacy.   Lab work: None ordered3 If you have labs (blood work) drawn today and your tests are completely normal, you will receive your results only by: Marland Kitchen MyChart Message (if you have MyChart) OR . A paper copy in the mail If you have any lab test that is abnormal or we need to change your treatment, we will call you to review the results.  Testing/Procedures: None ordered  Follow-Up: At [facility], you and your health needs are our priority.  As part of our continuing mission to provide you with exceptional heart care, we have created designated Provider Care Teams.  These Care Teams include your primary Cardiologist (physician) and Advanced Practice Providers (APPs -  Physician Assistants and Nurse Practitioners) who all work together to provide you with the care you need, when you need it. You will need a follow up appointment in 12 months.  Please call our office 2 months in advance to schedule this appointment.  You may see Dr. Debara Pickett or one of the following Advanced Practice Providers on your designated Care Team: Almyra Deforest, [place] . Fabian Sharp, PA-C

## 2019-01-15 NOTE — Telephone Encounter (Signed)
Phone was constantly busy, unable to contact patient.

## 2019-01-15 NOTE — Progress Notes (Signed)
OFFICE NOTE  Chief Complaint:  Memory loss  Primary Care Physician: Rochel Brome, MD  HPI:  Chelsea Phillips is a pleasant 85 year old female who still lives independently. She's been followed in the past by Dr. Rollene Fare prior to his retirement for atrial fibrillation. Her first episode of atrial fibrillation was 7 years ago and she had very little A. fib for which he was symptomatic until she was hospitalized in July of 2013 with A. fib and RVR. She was placed on Cardizem and Lopressor and her thyroid medicine was increased and she was noted to be hypothyroid. She spontaneously converted back to sinus and has been on warfarin. Recently she saw Dr. Rollene Fare and there was some question about possibly coming off of warfarin or switching her to an alternative NOAC.  She wore a monitor in April of this year and did have some atrial fibrillation detected. She denies any chest pain or worsening shortness of breath. She does have a history of some mild hypertension currently only on losartan and metoprolol.   Chelsea Phillips returns today for followup.  She is doing well on Eliquis. She denies any palpitations or recurrent atrial fibrillation. She's had no bleeding complications. She recently had a minor fall and does have some bruising on her right shin but did not hit her head. She generally feels fairly stable and does use a cane for extra support. She reports the swelling has improved with the addition of a diuretic.  I had the pleasure of seeing Chelsea Phillips back in the office today. Overall she seems to be doing fairly well. She unfortunately had one fall which was related to dizziness where she was getting into her car and fell in her car port. She did hit her head on the concrete. She was to get emergency department but did not undergo CT scanning, despite being on Eliquis. Fortunately there was no intracranial bleed. Subsequently she's had no further falls. She's had no bleeding episodes  related to Eliquis. Heart rate seems to be well-controlled and she is in sinus rhythm (or a low atrial rhythm) today.  03/09/2016  Chelsea Phillips was seen today in follow-up. Over the past year she's done fairly well. In fact she's had no bleeding problems on her Eliquis. She denies any worsening shortness of breath or chest pain. In fact there is no atrial fibrillation noted today on her EKG. Blood pressure is well-controlled. She is on low-dose niacin for cholesterol although she would not tolerate increased doses of that due to flushing. I did review the most recent data with her regarding niacin and the fact that it is likely minimally effective for cholesterol. Given her age and the fact this is being used for primary prevention, I advised her to discontinue that today as there is little benefit.  02/22/2017  Chelsea Phillips was seen today in follow-up. Her only issue is some ongoing increased urination at night. She seen urology and been on a number of medications with no benefit. She denies any recurrent A. fib. She has no bleeding problems on Eliquis. Her blood pressures monitored at home and shows very good control. She is on some thyroid medication monitor by her primary. She takes low-dose amlodipine as well as metoprolol for blood pressure. She denies chest pain or worsening shortness of breath.  01/15/2019  Chelsea Phillips is seen today in follow-up.  Overall her daughter says she is doing well physically however mentally she has had significant memory loss.  She recently  started on donepezil.  Her blood pressures been well controlled.  She denies any chest pain or shortness of breath.  She has had no recurrent A. fib.  Unfortunately she ran out of her Eliquis.  She does not need any refills with that.  She denies any falls and her daughter says primarily she uses a rolling walker is in a wheelchair.  I reviewed labs from July 14 (about a week and a half ago) which shows a creatinine of 1.6.  This  is increased from her creatinine which was 1.29 in January 2020.  Given her age and creatinine over 1.5, her dose of Eliquis should be adjusted to 2.5.  Is not clear what the etiology of the elevated creatinine is.  May be dehydration, however there are no medication she takes which would be responsible for this.  PMHx:  Past Medical History:  Diagnosis Date  . Arthritis   . Diverticulosis of colon (without mention of hemorrhage)   . Esophageal reflux   . Hiatal hernia   . History of atrial fibrillation    with RVR (hopsitalized 12/2011) - converted spontanesouly  . Hypertension   . Hypothyroidism   . Lumbar disc disease   . Stricture and stenosis of esophagus   . Venous insufficiency     Past Surgical History:  Procedure Laterality Date  . ABDOMINAL HYSTERECTOMY    . APPENDECTOMY    . TRANSTHORACIC ECHOCARDIOGRAM  06/19/2012   EF 47-09%, grade 1 diastolic dysfunction; mild AV regurg; mild MR, calcified MV annulus; mild AI; normal PA pressure     FAMHx:  Family History  Problem Relation Age of Onset  . Heart disease Mother   . Leukemia Sister     SOCHx:   reports that she has never smoked. She has never used smokeless tobacco. She reports that she does not drink alcohol or use drugs.  ALLERGIES:  No Known Allergies  ROS: Pertinent items noted in HPI and remainder of comprehensive ROS otherwise negative.  HOME MEDS: Current Outpatient Medications  Medication Sig Dispense Refill  . amLODipine (NORVASC) 5 MG tablet TAKE 1 TABLET (5 MG TOTAL) BY MOUTH DAILY. OFFICE VISIT NEEDED 40 tablet 0  . donepezil (ARICEPT) 10 MG tablet PLEASE SEE ATTACHED FOR DETAILED DIRECTIONS    . ELIQUIS 5 MG TABS tablet TAKE 1 TABLET BY MOUTH TWICE A DAY 180 tablet 1  . HYDROcodone-acetaminophen (NORCO/VICODIN) 5-325 MG per tablet Take 1 tablet by mouth every 6 (six) hours as needed for pain.    Marland Kitchen levothyroxine (SYNTHROID, LEVOTHROID) 75 MCG tablet Take 75 mcg by mouth daily before breakfast.     . metoprolol (LOPRESSOR) 50 MG tablet Take 0.5 tablets (25 mg total) by mouth 2 (two) times daily. 30 tablet 5  . Omega-3 Fatty Acids (OMEGA-3 1450 PO) Take 1 capsule by mouth daily.     No current facility-administered medications for this visit.     LABS/IMAGING: No results found for this or any previous visit (from the past 48 hour(s)). No results found.  VITALS: BP 117/70   Pulse (!) 104   Ht 5' (1.524 m)   Wt 168 lb (76.2 kg)   BMI 32.81 kg/m   EXAM: General appearance: alert and no distress Neck: no carotid bruit, no JVD and thyroid not enlarged, symmetric, no tenderness/mass/nodules Lungs: clear to auscultation bilaterally Heart: regular rate and rhythm, S1, S2 normal, no murmur, click, rub or gallop Abdomen: soft, non-tender; bowel sounds normal; no masses,  no organomegaly Extremities: extremities  normal, atraumatic, no cyanosis or edema Pulses: 2+ and symmetric Skin: Skin color, texture, turgor normal. No rashes or lesions Neurologic: Grossly normal Psych: Pleasant  EKG: Sinus tachycardia at 104, low voltage QRS-personally reviewed  ASSESSMENT: 1. Paroxysmal atrial fibrillation - CHADSVASC 4 on Eliquis 2. Hypertension 3. Leg edema - improved 4. Hypothyroidism 5. GERD  6. Elevated creatinine 1.64  PLAN: 1.   Chelsea Phillips has a history of paroxysmal atrial fibrillation but is in a sinus tachycardia today.  She denies any chest pain or shortness of breath however her creatinine is elevated 1.64.  This was not abnormal in January of this year.  She will need a dose adjustment on her Eliquis down to 2.5 mg twice daily when we refill it.  The etiology of her increased heart rate and worsening creatinine are not clear however could be dehydration or worsening renal dysfunction.  I will defer this to her primary care provider to work-up.  Pixie Casino, MD, [facility], [place] Director of the Advanced Lipid Disorders &   Cardiovascular Risk Reduction Clinic Diplomate of the American Board of Clinical Lipidology Attending Cardiologist  Direct Dial: [phone]  Fax: [phone]  Website:  [email or web address] 01/15/2019, 4:01 PM

## 2019-02-12 ENCOUNTER — Other Ambulatory Visit: Payer: Self-pay | Admitting: Internal Medicine

## 2019-03-14 DIAGNOSIS — S8265XA Nondisplaced fracture of lateral malleolus of left fibula, initial encounter for closed fracture: Secondary | ICD-10-CM | POA: Diagnosis not present

## 2019-03-14 DIAGNOSIS — S8255XA Nondisplaced fracture of medial malleolus of left tibia, initial encounter for closed fracture: Secondary | ICD-10-CM | POA: Diagnosis not present

## 2019-03-14 DIAGNOSIS — M79662 Pain in left lower leg: Secondary | ICD-10-CM | POA: Diagnosis not present

## 2019-03-14 DIAGNOSIS — S82832A Other fracture of upper and lower end of left fibula, initial encounter for closed fracture: Secondary | ICD-10-CM | POA: Diagnosis not present

## 2019-03-14 DIAGNOSIS — M25572 Pain in left ankle and joints of left foot: Secondary | ICD-10-CM | POA: Diagnosis not present

## 2019-03-20 DIAGNOSIS — S8253XA Displaced fracture of medial malleolus of unspecified tibia, initial encounter for closed fracture: Secondary | ICD-10-CM | POA: Diagnosis not present

## 2019-04-02 ENCOUNTER — Other Ambulatory Visit: Payer: Self-pay

## 2019-04-02 ENCOUNTER — Other Ambulatory Visit: Payer: Self-pay | Admitting: Podiatry

## 2019-04-02 ENCOUNTER — Ambulatory Visit (INDEPENDENT_AMBULATORY_CARE_PROVIDER_SITE_OTHER): Payer: Medicare Other

## 2019-04-02 ENCOUNTER — Ambulatory Visit: Payer: Medicare Other | Admitting: Podiatry

## 2019-04-02 DIAGNOSIS — M779 Enthesopathy, unspecified: Secondary | ICD-10-CM

## 2019-04-02 DIAGNOSIS — S8252XA Displaced fracture of medial malleolus of left tibia, initial encounter for closed fracture: Secondary | ICD-10-CM

## 2019-04-02 DIAGNOSIS — M25572 Pain in left ankle and joints of left foot: Secondary | ICD-10-CM

## 2019-04-02 DIAGNOSIS — M25571 Pain in right ankle and joints of right foot: Secondary | ICD-10-CM

## 2019-04-11 ENCOUNTER — Other Ambulatory Visit: Payer: Self-pay | Admitting: Podiatry

## 2019-04-11 DIAGNOSIS — M779 Enthesopathy, unspecified: Secondary | ICD-10-CM

## 2019-04-17 ENCOUNTER — Ambulatory Visit: Payer: Medicare Other | Admitting: Podiatry

## 2019-04-17 ENCOUNTER — Other Ambulatory Visit: Payer: Self-pay | Admitting: Podiatry

## 2019-04-17 ENCOUNTER — Ambulatory Visit (INDEPENDENT_AMBULATORY_CARE_PROVIDER_SITE_OTHER): Payer: Medicare Other

## 2019-04-17 ENCOUNTER — Other Ambulatory Visit: Payer: Self-pay

## 2019-04-17 DIAGNOSIS — S8252XA Displaced fracture of medial malleolus of left tibia, initial encounter for closed fracture: Secondary | ICD-10-CM | POA: Diagnosis not present

## 2019-04-17 DIAGNOSIS — M25572 Pain in left ankle and joints of left foot: Secondary | ICD-10-CM

## 2019-04-17 NOTE — Progress Notes (Signed)
  Subjective:  Patient ID: Chelsea Phillips, female    DOB: 1922-01-25,  MRN: [id number]  No chief complaint on file.   85 y.o. female presents for f/u fracture left leg. Having difficulty with the cast. Denies new issues.  Review of Systems: Negative except as noted in the HPI. Denies N/V/F/Ch.  Past Medical History:  Diagnosis Date  . Arthritis   . Diverticulosis of colon (without mention of hemorrhage)   . Esophageal reflux   . Hiatal hernia   . History of atrial fibrillation    with RVR (hopsitalized 12/2011) - converted spontanesouly  . Hypertension   . Hypothyroidism   . Lumbar disc disease   . Stricture and stenosis of esophagus   . Venous insufficiency     Current Outpatient Medications:  .  amLODipine (NORVASC) 5 MG tablet, Take 1 tablet (5 mg total) by mouth daily., Disp: 90 tablet, Rfl: 2 .  apixaban (ELIQUIS) 2.5 MG TABS tablet, Take 1 tablet (2.5 mg total) by mouth 2 (two) times daily., Disp: 180 tablet, Rfl: 3 .  donepezil (ARICEPT) 10 MG tablet, PLEASE SEE ATTACHED FOR DETAILED DIRECTIONS, Disp: , Rfl:  .  HYDROcodone-acetaminophen (NORCO/VICODIN) 5-325 MG per tablet, Take 1 tablet by mouth every 6 (six) hours as needed for pain., Disp: , Rfl:  .  levothyroxine (SYNTHROID, LEVOTHROID) 75 MCG tablet, Take 75 mcg by mouth daily before breakfast., Disp: , Rfl:  .  metoprolol (LOPRESSOR) 50 MG tablet, Take 0.5 tablets (25 mg total) by mouth 2 (two) times daily., Disp: 30 tablet, Rfl: 5 .  metoprolol succinate (TOPROL-XL) 100 MG 24 hr tablet, Take 100 mg by mouth daily., Disp: , Rfl:  .  Omega-3 Fatty Acids (OMEGA-3 1450 PO), Take 1 capsule by mouth daily., Disp: , Rfl:   Social History   Tobacco Use  Smoking Status Never Smoker  Smokeless Tobacco Never Used    No Known Allergies Objective:  There were no vitals filed for this visit. There is no height or weight on file to calculate BMI. Constitutional Well developed. Well nourished.  Vascular Dorsalis pedis  pulses palpable bilaterally. Posterior tibial pulses palpable bilaterally. Capillary refill normal to all digits.  No cyanosis or clubbing noted. Pedal hair growth normal.  Neurologic Normal speech. Oriented to person, place, and time. Epicritic sensation to light touch grossly present bilaterally.  Dermatologic Nails well groomed and normal in appearance. No open wounds. No skin lesions.  Orthopedic: POP left medial ankle about the medial malleolus.   Radiographs: Taken and reviewed. Unchanged medial malleolus fracture fragment. Assessment:   1. Closed displaced fracture of medial malleolus of left tibia, initial encounter    Plan:  Patient was evaluated and treated and all questions answered.  Fracture of medial malleolus, left -Cast removed. -Unna boot applied -WBAT in CAM boot.   Return in about 2 weeks (around 05/01/2019).

## 2019-04-17 NOTE — Progress Notes (Signed)
Subjective:  Patient ID: Chelsea Phillips, female    DOB: [birth year],  MRN: [id number]  Chief Complaint  Patient presents with  . Foot Injury    PT's daughter states her mother fell on the bathroom and landed on her LT foot x Sept 23; very sore -2-3 wks ago Pt was applied a cast -dx with fx of anke and fibula Tx: hydrocodone -pt denies N/V/F/Ch     85 y.o. female presents with the above complaint. Hx as above.  Has history of chronic ankle issues for which she wears a brace  Review of Systems: Negative except as noted in the HPI. Denies N/V/F/Ch.  Past Medical History:  Diagnosis Date  . Arthritis   . Diverticulosis of colon (without mention of hemorrhage)   . Esophageal reflux   . Hiatal hernia   . History of atrial fibrillation    with RVR (hopsitalized 12/2011) - converted spontanesouly  . Hypertension   . Hypothyroidism   . Lumbar disc disease   . Stricture and stenosis of esophagus   . Venous insufficiency     Current Outpatient Medications:  .  amLODipine (NORVASC) 5 MG tablet, Take 1 tablet (5 mg total) by mouth daily., Disp: 90 tablet, Rfl: 2 .  apixaban (ELIQUIS) 2.5 MG TABS tablet, Take 1 tablet (2.5 mg total) by mouth 2 (two) times daily., Disp: 180 tablet, Rfl: 3 .  donepezil (ARICEPT) 10 MG tablet, PLEASE SEE ATTACHED FOR DETAILED DIRECTIONS, Disp: , Rfl:  .  HYDROcodone-acetaminophen (NORCO/VICODIN) 5-325 MG per tablet, Take 1 tablet by mouth every 6 (six) hours as needed for pain., Disp: , Rfl:  .  levothyroxine (SYNTHROID, LEVOTHROID) 75 MCG tablet, Take 75 mcg by mouth daily before breakfast., Disp: , Rfl:  .  metoprolol (LOPRESSOR) 50 MG tablet, Take 0.5 tablets (25 mg total) by mouth 2 (two) times daily., Disp: 30 tablet, Rfl: 5 .  metoprolol succinate (TOPROL-XL) 100 MG 24 hr tablet, Take 100 mg by mouth daily., Disp: , Rfl:  .  Omega-3 Fatty Acids (OMEGA-3 1450 PO), Take 1 capsule by mouth daily., Disp: , Rfl:   Social History   Tobacco Use  Smoking  Status Never Smoker  Smokeless Tobacco Never Used    No Known Allergies Objective:  There were no vitals filed for this visit. There is no height or weight on file to calculate BMI. Constitutional Well developed. Well nourished.  Vascular Dorsalis pedis pulses palpable bilaterally. Posterior tibial pulses palpable bilaterally. Capillary refill normal to all digits.  No cyanosis or clubbing noted. Pedal hair growth normal.  Neurologic Normal speech. Oriented to person, place, and time. Epicritic sensation to light touch grossly present bilaterally.  Dermatologic Nails well groomed and normal in appearance. No open wounds. No skin lesions.  Orthopedic: POP left medial ankle about the medial malleolus.   Radiographs: Taken and reviewed.  Small age-indeterminate medial malleolus avulsion Assessment:   1. Closed displaced fracture of medial malleolus of left tibia, initial encounter    Plan:  Patient was evaluated and treated and all questions answered.  Fracture of medial malleolus, left -XR reviewed as above.  Discussed ankle valgus chronic however given that she has pain and evidence of medial malleolus fx though age indeterminate we can immobilize her in a cast for a few more weeks -WB Status: NWB in wheelchair -Would benefit from non-operative management for this injury.  Procedure: Short Leg Cast Application  Rationale: above injury Technique: Well padded short leg cast applied with 3 layers of  fiberglass casting material. Disposition: Patient tolerated procedure well. NV status intact post-application.    No follow-ups on file.

## 2019-04-20 ENCOUNTER — Other Ambulatory Visit: Payer: Self-pay | Admitting: Podiatry

## 2019-04-20 DIAGNOSIS — S8252XA Displaced fracture of medial malleolus of left tibia, initial encounter for closed fracture: Secondary | ICD-10-CM

## 2019-04-30 ENCOUNTER — Other Ambulatory Visit: Payer: Self-pay | Admitting: Podiatry

## 2019-04-30 DIAGNOSIS — M79672 Pain in left foot: Secondary | ICD-10-CM

## 2019-05-01 ENCOUNTER — Other Ambulatory Visit: Payer: Self-pay

## 2019-05-01 ENCOUNTER — Ambulatory Visit (INDEPENDENT_AMBULATORY_CARE_PROVIDER_SITE_OTHER): Payer: Medicare Other

## 2019-05-01 ENCOUNTER — Ambulatory Visit: Payer: Medicare Other | Admitting: Podiatry

## 2019-05-01 ENCOUNTER — Other Ambulatory Visit: Payer: Self-pay | Admitting: Podiatry

## 2019-05-01 DIAGNOSIS — S8252XA Displaced fracture of medial malleolus of left tibia, initial encounter for closed fracture: Secondary | ICD-10-CM

## 2019-05-01 DIAGNOSIS — M79672 Pain in left foot: Secondary | ICD-10-CM

## 2019-05-01 DIAGNOSIS — M76829 Posterior tibial tendinitis, unspecified leg: Secondary | ICD-10-CM

## 2019-06-04 ENCOUNTER — Ambulatory Visit: Payer: Medicare Other | Admitting: Podiatry

## 2019-06-04 ENCOUNTER — Other Ambulatory Visit: Payer: Self-pay

## 2019-06-04 DIAGNOSIS — M76829 Posterior tibial tendinitis, unspecified leg: Secondary | ICD-10-CM | POA: Diagnosis not present

## 2019-06-04 DIAGNOSIS — M21072 Valgus deformity, not elsewhere classified, left ankle: Secondary | ICD-10-CM | POA: Diagnosis not present

## 2019-06-17 NOTE — Progress Notes (Signed)
  Subjective:  Patient ID: OVETTA SPEISER, female    DOB: 02/07/1922,  MRN: [id number]  Chief Complaint  Patient presents with  . Foot Injury    F/U Lt leg fx Pt. states," doing goot.": -pt dneis pain/redness/swelling Tx: boot    85 y.o. female presents for f/u fracture left leg. Denies issues .  Review of Systems: Negative except as noted in the HPI. Denies N/V/F/Ch.  Past Medical History:  Diagnosis Date  . Arthritis   . Diverticulosis of colon (without mention of hemorrhage)   . Esophageal reflux   . Hiatal hernia   . History of atrial fibrillation    with RVR (hopsitalized 12/2011) - converted spontanesouly  . Hypertension   . Hypothyroidism   . Lumbar disc disease   . Stricture and stenosis of esophagus   . Venous insufficiency     Current Outpatient Medications:  .  amLODipine (NORVASC) 5 MG tablet, Take 1 tablet (5 mg total) by mouth daily., Disp: 90 tablet, Rfl: 2 .  apixaban (ELIQUIS) 2.5 MG TABS tablet, Take 1 tablet (2.5 mg total) by mouth 2 (two) times daily., Disp: 180 tablet, Rfl: 3 .  donepezil (ARICEPT) 10 MG tablet, PLEASE SEE ATTACHED FOR DETAILED DIRECTIONS, Disp: , Rfl:  .  HYDROcodone-acetaminophen (NORCO/VICODIN) 5-325 MG per tablet, Take 1 tablet by mouth every 6 (six) hours as needed for pain., Disp: , Rfl:  .  levothyroxine (SYNTHROID, LEVOTHROID) 75 MCG tablet, Take 75 mcg by mouth daily before breakfast., Disp: , Rfl:  .  metoprolol (LOPRESSOR) 50 MG tablet, Take 0.5 tablets (25 mg total) by mouth 2 (two) times daily., Disp: 30 tablet, Rfl: 5 .  metoprolol succinate (TOPROL-XL) 100 MG 24 hr tablet, Take 100 mg by mouth daily., Disp: , Rfl:  .  Omega-3 Fatty Acids (OMEGA-3 1450 PO), Take 1 capsule by mouth daily., Disp: , Rfl:   Social History   Tobacco Use  Smoking Status Never Smoker  Smokeless Tobacco Never Used    No Known Allergies Objective:  There were no vitals filed for this visit. There is no height or weight on file to calculate  BMI. Constitutional Well developed. Well nourished.  Vascular Dorsalis pedis pulses palpable bilaterally. Posterior tibial pulses palpable bilaterally. Capillary refill normal to all digits.  No cyanosis or clubbing noted. Pedal hair growth normal.  Neurologic Normal speech. Oriented to person, place, and time. Epicritic sensation to light touch grossly present bilaterally.  Dermatologic Nails well groomed and normal in appearance. No open wounds. No skin lesions.  Orthopedic: POP left medial ankle about the medial malleolus.   Radiographs: Taken and reviewed. Unchanged medial malleolus fracture fragment. Assessment:   1. Closed displaced fracture of medial malleolus of left tibia, initial encounter   2. PTTD (posterior tibial tendon dysfunction)    Plan:  Patient was evaluated and treated and all questions answered.  Fracture of medial malleolus, left -Transition back to ankle brace with sneaker -Fu in 1 month for recheck.  Return in about 1 month (around 05/31/2019) for f/u ankle injury left no XRs .

## 2019-06-24 NOTE — Progress Notes (Signed)
  Subjective:  Patient ID: Chelsea Phillips, female    DOB: Jan 05, 1922,  MRN: [id number]  Chief Complaint  Patient presents with  . Foot Injury    F?U Lt fx Pt. states," pretty good, really no apin." tx: brace    86 y.o. female presents for f/u fracture left leg. Denies issues . Using brace, mobilizing without issue.  Review of Systems: Negative except as noted in the HPI. Denies N/V/F/Ch.  Past Medical History:  Diagnosis Date  . Arthritis   . Diverticulosis of colon (without mention of hemorrhage)   . Esophageal reflux   . Hiatal hernia   . History of atrial fibrillation    with RVR (hopsitalized 12/2011) - converted spontanesouly  . Hypertension   . Hypothyroidism   . Lumbar disc disease   . Stricture and stenosis of esophagus   . Venous insufficiency     Current Outpatient Medications:  .  amLODipine (NORVASC) 5 MG tablet, Take 1 tablet (5 mg total) by mouth daily., Disp: 90 tablet, Rfl: 2 .  apixaban (ELIQUIS) 2.5 MG TABS tablet, Take 1 tablet (2.5 mg total) by mouth 2 (two) times daily., Disp: 180 tablet, Rfl: 3 .  donepezil (ARICEPT) 10 MG tablet, PLEASE SEE ATTACHED FOR DETAILED DIRECTIONS, Disp: , Rfl:  .  HYDROcodone-acetaminophen (NORCO/VICODIN) 5-325 MG per tablet, Take 1 tablet by mouth every 6 (six) hours as needed for pain., Disp: , Rfl:  .  levothyroxine (SYNTHROID, LEVOTHROID) 75 MCG tablet, Take 75 mcg by mouth daily before breakfast., Disp: , Rfl:  .  metoprolol (LOPRESSOR) 50 MG tablet, Take 0.5 tablets (25 mg total) by mouth 2 (two) times daily., Disp: 30 tablet, Rfl: 5 .  metoprolol succinate (TOPROL-XL) 100 MG 24 hr tablet, Take 100 mg by mouth daily., Disp: , Rfl:  .  Omega-3 Fatty Acids (OMEGA-3 1450 PO), Take 1 capsule by mouth daily., Disp: , Rfl:   Social History   Tobacco Use  Smoking Status Never Smoker  Smokeless Tobacco Never Used    No Known Allergies Objective:  There were no vitals filed for this visit. There is no height or weight  on file to calculate BMI. Constitutional Well developed. Well nourished.  Vascular Dorsalis pedis pulses palpable bilaterally. Posterior tibial pulses palpable bilaterally. Capillary refill normal to all digits.  No cyanosis or clubbing noted. Pedal hair growth normal.  Neurologic Normal speech. Oriented to person, place, and time. Epicritic sensation to light touch grossly present bilaterally.  Dermatologic Nails well groomed and normal in appearance. No open wounds. No skin lesions.  Orthopedic: No POP left medial ankle about the medial malleolus.    Radiographs: none Assessment:   1. PTTD (posterior tibial tendon dysfunction)   2. Acquired valgus deformity of left ankle    Plan:  Patient was evaluated and treated and all questions answered.  Fracture of medial malleolus, left -No pain, continue brace and sneaker -f/u PRN -Nails debrided courtesy  No follow-ups on file.

## 2019-08-13 ENCOUNTER — Encounter: Payer: Self-pay | Admitting: Family Medicine

## 2019-08-13 ENCOUNTER — Other Ambulatory Visit: Payer: Self-pay

## 2019-08-13 ENCOUNTER — Ambulatory Visit (INDEPENDENT_AMBULATORY_CARE_PROVIDER_SITE_OTHER): Payer: Medicare Other | Admitting: Family Medicine

## 2019-08-13 VITALS — BP 124/70 | HR 62 | Temp 97.7°F

## 2019-08-13 DIAGNOSIS — N1832 Chronic kidney disease, stage 3b: Secondary | ICD-10-CM

## 2019-08-13 DIAGNOSIS — I10 Essential (primary) hypertension: Secondary | ICD-10-CM | POA: Diagnosis not present

## 2019-08-13 DIAGNOSIS — F1120 Opioid dependence, uncomplicated: Secondary | ICD-10-CM

## 2019-08-13 DIAGNOSIS — I129 Hypertensive chronic kidney disease with stage 1 through stage 4 chronic kidney disease, or unspecified chronic kidney disease: Secondary | ICD-10-CM | POA: Diagnosis not present

## 2019-08-13 DIAGNOSIS — G894 Chronic pain syndrome: Secondary | ICD-10-CM

## 2019-08-13 DIAGNOSIS — F0280 Dementia in other diseases classified elsewhere without behavioral disturbance: Secondary | ICD-10-CM

## 2019-08-13 DIAGNOSIS — E039 Hypothyroidism, unspecified: Secondary | ICD-10-CM | POA: Diagnosis not present

## 2019-08-13 DIAGNOSIS — I482 Chronic atrial fibrillation, unspecified: Secondary | ICD-10-CM

## 2019-08-13 DIAGNOSIS — M170 Bilateral primary osteoarthritis of knee: Secondary | ICD-10-CM

## 2019-08-13 DIAGNOSIS — D6869 Other thrombophilia: Secondary | ICD-10-CM

## 2019-08-13 DIAGNOSIS — G301 Alzheimer's disease with late onset: Secondary | ICD-10-CM

## 2019-08-13 DIAGNOSIS — L304 Erythema intertrigo: Secondary | ICD-10-CM

## 2019-08-13 MED ORDER — HYDROCODONE-ACETAMINOPHEN 5-325 MG PO TABS: 1.0000 | ORAL_TABLET | Freq: Four times a day (QID) | ORAL | 0 refills | Status: DC | PRN

## 2019-08-13 MED ORDER — CLOTRIMAZOLE-BETAMETHASONE 1-0.05 % EX CREA: 1.0000 "application " | TOPICAL_CREAM | Freq: Two times a day (BID) | CUTANEOUS | 2 refills | Status: DC

## 2019-08-13 NOTE — Patient Instructions (Addendum)
Rx for lotrisone sent.  Continue hydrocodone/apap for severe knee pain.  Recommend strengthening exercises and balance exercises. Labs drawn.  Exercises To Do While Sitting  Exercises that you do while sitting (chair exercises) can give you many of the same benefits as full exercise. Benefits include strengthening your heart, burning calories, and keeping muscles and joints healthy. Exercise can also improve your mood and help with depression and anxiety. You may benefit from chair exercises if you are unable to do standing exercises because of:  Diabetic foot pain.  Obesity.  Illness.  Arthritis.  Recovery from surgery or injury.  Breathing problems.  Balance problems.  Another type of disability. Before starting chair exercises, check with your health care provider or a physical therapist to find out how much exercise you can tolerate and which exercises are safe for you. If your health care provider approves:  Start out slowly and build up over time. Aim to work up to about 10-20 minutes for each exercise session.  Make exercise part of your daily routine.  Drink water when you exercise. Do not wait until you are thirsty. Drink every 10-15 minutes.  Stop exercising right away if you have pain, nausea, shortness of breath, or dizziness.  If you are exercising in a wheelchair, make sure to lock the wheels.  Ask your health care provider whether you can do tai chi or yoga. Many positions in these mind-body exercises can be modified to do while seated. Warm-up Before starting other exercises: 1. Sit up as straight as you can. Have your knees bent at 90 degrees, which is the shape of the capital letter "L." Keep your feet flat on the floor. 2. Sit at the front edge of your chair, if you can. 3. Pull in (tighten) the muscles in your abdomen and stretch your spine and neck as straight as you can. Hold this position for a few minutes. 4. Breathe in and out evenly. Try to  concentrate on your breathing, and relax your mind. Stretching Exercise A: Arm stretch 1. Hold your arms out straight in front of your body. 2. Bend your hands at the wrist with your fingers pointing up, as if signaling someone to stop. Notice the slight tension in your forearms as you hold the position. 3. Keeping your arms out and your hands bent, rotate your hands outward as far as you can and hold this stretch. Aim to have your thumbs pointing up and your pinkie fingers pointing down. Slowly repeat arm stretches for one minute as tolerated. Exercise B: Leg stretch 1. If you can move your legs, try to "draw" letters on the floor with the toes of your foot. Write your name with one foot. 2. Write your name with the toes of your other foot. Slowly repeat the movements for one minute as tolerated. Exercise C: Reach for the sky 1. Reach your hands as far over your head as you can to stretch your spine. 2. Move your hands and arms as if you are climbing a rope. Slowly repeat the movements for one minute as tolerated. Range of motion exercises Exercise A: Shoulder roll 1. Let your arms hang loosely at your sides. 2. Lift just your shoulders up toward your ears, then let them relax back down. 3. When your shoulders feel loose, rotate your shoulders in backward and forward circles. Do shoulder rolls slowly for one minute as tolerated. Exercise B: March in place 1. As if you are marching, pump your arms and lift your  legs up and down. Lift your knees as high as you can. ? If you are unable to lift your knees, just pump your arms and move your ankles and feet up and down. March in place for one minute as tolerated. Exercise C: Seated jumping jacks 1. Let your arms hang down straight. 2. Keeping your arms straight, lift them up over your head. Aim to point your fingers to the ceiling. 3. While you lift your arms, straighten your legs and slide your heels along the floor to your sides, as wide as  you can. 4. As you bring your arms back down to your sides, slide your legs back together. ? If you are unable to use your legs, just move your arms. Slowly repeat seated jumping jacks for one minute as tolerated. Strengthening exercises Exercise A: Shoulder squeeze 1. Hold your arms straight out from your body to your sides, with your elbows bent and your fists pointed at the ceiling. 2. Keeping your arms in the bent position, move them forward so your elbows and forearms meet in front of your face. 3. Open your arms back out as wide as you can with your elbows still bent, until you feel your shoulder blades squeezing together. Hold for 5 seconds. Slowly repeat the movements forward and backward for one minute as tolerated. Contact a health care provider if you:  Had to stop exercising due to any of the following: ? Pain. ? Nausea. ? Shortness of breath. ? Dizziness. ? Fatigue.  Have significant pain or soreness after exercising. Get help right away if you have:  Chest pain.  Difficulty breathing. These symptoms may represent a serious problem that is an emergency. Do not wait to see if the symptoms will go away. Get medical help right away. Call your local emergency services (911 in the [place] Do not drive yourself to the hospital. This information is not intended to replace advice given to you by your health care provider. Make sure you discuss any questions you have with your health care provider. Document Revised: 09/28/2018 Document Reviewed: 04/20/2017 [employer] Patient Education  2020 [employer]  

## 2019-08-14 LAB — COMP. METABOLIC PANEL (12)
AST: 14 IU/L (ref 0–40)
Albumin/Globulin Ratio: 1.9 (ref 1.2–2.2)
Albumin: 4.4 g/dL (ref 3.5–4.6)
Alkaline Phosphatase: 82 IU/L (ref 39–117)
BUN/Creatinine Ratio: 19 (ref 12–28)
BUN: 22 mg/dL (ref 10–36)
Bilirubin Total: 0.4 mg/dL (ref 0.0–1.2)
Calcium: 9.4 mg/dL (ref 8.7–10.3)
Chloride: 100 mmol/L (ref 96–106)
Creatinine, Ser: 1.13 mg/dL — ABNORMAL HIGH (ref 0.57–1.00)
GFR calc Af Amer: 48 mL/min/{1.73_m2} — ABNORMAL LOW (ref >59)
GFR calc non Af Amer: 41 mL/min/{1.73_m2} — ABNORMAL LOW (ref >59)
Globulin, Total: 2.3 g/dL (ref 1.5–4.5)
Glucose: 88 mg/dL (ref 65–99)
Potassium: 4.6 mmol/L (ref 3.5–5.2)
Sodium: 140 mmol/L (ref 134–144)
Total Protein: 6.7 g/dL (ref 6.0–8.5)

## 2019-08-14 LAB — CBC WITH DIFFERENTIAL/PLATELET
Basophils Absolute: 0 10*3/uL (ref 0.0–0.2)
Basos: 0 %
EOS (ABSOLUTE): 0.1 10*3/uL (ref 0.0–0.4)
Eos: 2 %
Hematocrit: 45.3 % (ref 34.0–46.6)
Hemoglobin: 15.3 g/dL (ref 11.1–15.9)
Immature Grans (Abs): 0.1 10*3/uL (ref 0.0–0.1)
Immature Granulocytes: 1 %
Lymphocytes Absolute: 3.1 10*3/uL (ref 0.7–3.1)
Lymphs: 37 %
MCH: 33 pg (ref 26.6–33.0)
MCHC: 33.8 g/dL (ref 31.5–35.7)
MCV: 98 fL — ABNORMAL HIGH (ref 79–97)
Monocytes Absolute: 0.8 10*3/uL (ref 0.1–0.9)
Monocytes: 9 %
Neutrophils Absolute: 4.4 10*3/uL (ref 1.4–7.0)
Neutrophils: 51 %
Platelets: 214 10*3/uL (ref 150–450)
RBC: 4.64 x10E6/uL (ref 3.77–5.28)
RDW: 12.9 % (ref 11.7–15.4)
WBC: 8.6 10*3/uL (ref 3.4–10.8)

## 2019-08-14 LAB — TSH: TSH: 2.82 u[IU]/mL (ref 0.450–4.500)

## 2019-08-18 NOTE — Progress Notes (Signed)
Subjective:  Patient ID: Chelsea Phillips, female    DOB: 16-Sep-1921  Age: 86 y.o. MRN: [id number]  Chief Complaint  Patient presents with  . Gastroesophageal Reflux  . Hyperlipidemia  . Hypertension  . Hypothyroidism    HPI Patient is a 86 year old white female who presents for follow-up of hypertension, atrial fibrillation, hyperlipidemia, GERD, osteoarthritis of her knees, and hypothyroidism.  Patient eats fairly healthy.  She is unable to exercise due to her osteoarthritis of her knees.  She takes hydrocodone acetaminophen 5/325 1 4 times daily as needed severe knee pain.  She has atrial fibrillation for which she sees Dr. Debara Pickett, cardiology, and is treated with Eliquis 5 mg half pill twice daily, metoprolol succinate extended release 100 mg 1 daily.  In addition for hypertension she takes amlodipine 5 mg once daily.  She has had some late onset of Alzheimer's dementia and is currently on donepezil.  Per her caretaker her status is stable. Her only major complaint today is an irritating rash under her breasts/pannus. This is recurrent and usually responds well to lotrisone. They are requesting a refill.  Social History   Socioeconomic History  . Marital status: Widowed    Spouse name: Not on file  . Number of children: 3  . Years of education: 76  . Highest education level: Not on file  Occupational History  . Occupation: Retired  Tobacco Use  . Smoking status: Never Smoker  . Smokeless tobacco: Former Systems developer    Types: Snuff  Substance and Sexual Activity  . Alcohol use: No  . Drug use: No  . Sexual activity: Not on file  Other Topics Concern  . Not on file  Social History Narrative   wears sunscreen, brushes and flosses daily, see's dentist bi-annually, has smoke/carbon monoxide detectors, wears a seatbelt and practices gun safety   Social Determinants of Health   Financial Resource Strain:   . Difficulty of Paying Living Expenses: Not on file  Food Insecurity:   .  Worried About Charity fundraiser in the Last Year: Not on file  . Ran Out of Food in the Last Year: Not on file  Transportation Needs:   . Lack of Transportation (Medical): Not on file  . Lack of Transportation (Non-Medical): Not on file  Physical Activity:   . Days of Exercise per Week: Not on file  . Minutes of Exercise per Session: Not on file  Stress:   . Feeling of Stress : Not on file  Social Connections:   . Frequency of Communication with Friends and Family: Not on file  . Frequency of Social Gatherings with Friends and Family: Not on file  . Attends Religious Services: Not on file  . Active Member of Clubs or Organizations: Not on file  . Attends Archivist Meetings: Not on file  . Marital Status: Not on file   Past Medical History:  Diagnosis Date  . Arthritis   . Diverticulosis of colon (without mention of hemorrhage)   . Esophageal reflux   . ESOPHAGEAL STRICTURE 09/14/2007   Qualifier: Diagnosis of  By: Ronnald Ramp RN, [place], [place]    . Hiatal hernia   . History of atrial fibrillation    with RVR (hopsitalized 12/2011) - converted spontanesouly  . Hypertension   . Hypothyroidism   . Lumbar disc disease   . Stricture and stenosis of esophagus   . Venous insufficiency    Family History  Problem Relation Age of Onset  . Heart disease  Mother   . Leukemia Sister   . Heart disease Sister   . Alzheimer's disease Sister     Review of Systems  Constitutional: Negative for chills, fatigue and fever.  HENT: Negative for congestion, ear pain and sore throat.   Respiratory: Negative for cough and shortness of breath.   Cardiovascular: Negative for chest pain.  Gastrointestinal: Negative for abdominal pain, constipation, diarrhea, nausea and vomiting.  Endocrine: Negative for polydipsia, polyphagia and polyuria.  Genitourinary: Negative for dysuria and urgency.  Musculoskeletal: Positive for arthralgias. Negative for myalgias.  Psychiatric/Behavioral: Negative for  dysphoric mood. The patient is not nervous/anxious.      Objective:  BP 124/70 (BP Location: Right Arm, Patient Position: Sitting)   Pulse 62   Temp 97.7 F (36.5 C) (Temporal)   SpO2 96%   BP/Weight 08/13/2019 01/15/2019 [id number]  Systolic BP [id number]  Diastolic BP 70 70 61  Wt. (Lbs) - 168 175  BMI - 32.81 34.18    Physical Exam Vitals reviewed.  Constitutional:      Appearance: Normal appearance. She is obese.  Cardiovascular:     Rate and Rhythm: Normal rate and regular rhythm.     Pulses: Normal pulses.     Heart sounds: Normal heart sounds.  Pulmonary:     Effort: Pulmonary effort is normal. No respiratory distress.     Breath sounds: Normal breath sounds.  Abdominal:     General: Abdomen is flat. Bowel sounds are normal.     Palpations: Abdomen is soft.     Tenderness: There is no abdominal tenderness.  Musculoskeletal:     Comments: Brace on left ankle. In wheelchair.   Neurological:     Mental Status: She is alert and oriented to person, place, and time.  Psychiatric:        Mood and Affect: Mood normal.        Behavior: Behavior normal.    Fall Risk  08/26/2019  Falls in the past year? 1  Number falls in past yr: 1  Injury with Fall? 1  Risk for fall due to : Orthopedic patient;Impaired mobility;Impaired balance/gait;History of fall(s)  Follow up Falls evaluation completed;Falls prevention discussed    Lab Results  Component Value Date   WBC 8.6 08/13/2019   HGB 15.3 08/13/2019   HCT 45.3 08/13/2019   PLT 214 08/13/2019   GLUCOSE 88 08/13/2019   ALT 12 10/02/2014   AST 14 08/13/2019   NA 140 08/13/2019   K 4.6 08/13/2019   CL 100 08/13/2019   CREATININE 1.13 (H) 08/13/2019   BUN 22 08/13/2019   CO2 27 04/14/2016   TSH 2.820 08/13/2019   INR 1.9 (A) 04/12/2013      Assessment & Plan:  Hypertension, renal disease, stage 1-4 or unspecified chronic kidney disease Well controlled.  No changes to medicines.  Continue to work on eating a  healthy diet and exercise.  Labs drawn today.   Secondary hypothyroidism Well controlled.  No changes to medicines.  Labs drawn today.   Late onset Alzheimer's disease without behavioral disturbance (HCC) Stable. The current medical regimen is effective;  continue present plan and medications.  Osteoarthritis of both knees Severe.  Pain managed with vicodin. Fall risk. Spends much of time in wheelchair or uses walker.  Acquired thrombophilia ([place]) Due to eliquis. Is necessary due to atrial fibrillation.  Chronic pain syndrome Poor surgical candidate for OA of knees. Managed with vicodin.  Uncomplicated opioid dependence ([place]) Necessary to control pain.  Intertrigo Treated with lotrisone cream.   Meds ordered this encounter  Medications  . clotrimazole-betamethasone (LOTRISONE) cream    Sig: Apply 1 application topically 2 (two) times daily.    Dispense:  45 g    Refill:  2  . HYDROcodone-acetaminophen (NORCO/VICODIN) 5-325 MG tablet    Sig: Take 1 tablet by mouth every 6 (six) hours as needed for severe pain (osteoarthritis knees).    Dispense:  120 tablet    Refill:  0       Follow-up: Return in about 3 months (around 11/10/2019) for pain management..  AVS was given to patient prior to departure.  Rochel Brome [facility] [phone]

## 2019-08-26 ENCOUNTER — Encounter: Payer: Self-pay | Admitting: Family Medicine

## 2019-08-26 DIAGNOSIS — F0280 Dementia in other diseases classified elsewhere without behavioral disturbance: Secondary | ICD-10-CM | POA: Insufficient documentation

## 2019-08-26 DIAGNOSIS — N1832 Chronic kidney disease, stage 3b: Secondary | ICD-10-CM | POA: Insufficient documentation

## 2019-08-26 DIAGNOSIS — D6869 Other thrombophilia: Secondary | ICD-10-CM | POA: Insufficient documentation

## 2019-08-26 DIAGNOSIS — G894 Chronic pain syndrome: Secondary | ICD-10-CM | POA: Insufficient documentation

## 2019-08-26 DIAGNOSIS — L304 Erythema intertrigo: Secondary | ICD-10-CM | POA: Insufficient documentation

## 2019-08-26 DIAGNOSIS — F1120 Opioid dependence, uncomplicated: Secondary | ICD-10-CM | POA: Insufficient documentation

## 2019-08-26 DIAGNOSIS — M170 Bilateral primary osteoarthritis of knee: Secondary | ICD-10-CM | POA: Insufficient documentation

## 2019-08-26 NOTE — Assessment & Plan Note (Signed)
Necessary to control pain.

## 2019-08-26 NOTE — Assessment & Plan Note (Signed)
Well controlled.  No changes to medicines.    Labs drawn today.  

## 2019-08-26 NOTE — Assessment & Plan Note (Signed)
Treated with lotrisone cream.

## 2019-08-26 NOTE — Assessment & Plan Note (Signed)
Poor surgical candidate for OA of knees. Managed with vicodin.

## 2019-08-26 NOTE — Assessment & Plan Note (Signed)
Stable. The current medical regimen is effective;  continue present plan and medications.  

## 2019-08-26 NOTE — Assessment & Plan Note (Signed)
Well controlled.  ?No changes to medicines.  ?Continue to work on eating a healthy diet and exercise.  ?Labs drawn today.  ?

## 2019-08-26 NOTE — Assessment & Plan Note (Signed)
Severe.  Pain managed with vicodin. Fall risk. Spends much of time in wheelchair or uses walker.

## 2019-08-26 NOTE — Assessment & Plan Note (Signed)
Due to eliquis. Is necessary due to atrial fibrillation.

## 2019-09-12 ENCOUNTER — Encounter: Payer: Self-pay | Admitting: Internal Medicine

## 2019-09-12 ENCOUNTER — Other Ambulatory Visit: Payer: Self-pay

## 2019-09-12 ENCOUNTER — Ambulatory Visit: Payer: Medicare Other | Admitting: Internal Medicine

## 2019-09-12 VITALS — BP 93/60 | HR 75 | Temp 97.7°F | Ht 60.0 in | Wt 165.0 lb

## 2019-09-12 DIAGNOSIS — I480 Paroxysmal atrial fibrillation: Secondary | ICD-10-CM

## 2019-09-12 DIAGNOSIS — I10 Essential (primary) hypertension: Secondary | ICD-10-CM

## 2019-09-12 DIAGNOSIS — Z7901 Long term (current) use of anticoagulants: Secondary | ICD-10-CM

## 2019-09-12 MED ORDER — APIXABAN 2.5 MG PO TABS: 5.0000 mg | ORAL_TABLET | Freq: Every day | ORAL | 11 refills | Status: DC

## 2019-09-12 NOTE — Progress Notes (Signed)
OFFICE NOTE  Chief Complaint:  Drainage  Primary Care Physician: Rochel Brome, MD  HPI:  Chelsea Phillips is a pleasant 86 year old female who still lives independently. She's been followed in the past by Dr. Rollene Fare prior to his retirement for atrial fibrillation. Her first episode of atrial fibrillation was 7 years ago and she had very little A. fib for which he was symptomatic until she was hospitalized in July of 2013 with A. fib and RVR. She was placed on Cardizem and Lopressor and her thyroid medicine was increased and she was noted to be hypothyroid. She spontaneously converted back to sinus and has been on warfarin. Recently she saw Dr. Rollene Fare and there was some question about possibly coming off of warfarin or switching her to an alternative NOAC.  She wore a monitor in April of this year and did have some atrial fibrillation detected. She denies any chest pain or worsening shortness of breath. She does have a history of some mild hypertension currently only on losartan and metoprolol.   Chelsea Phillips returns today for followup.  She is doing well on Eliquis. She denies any palpitations or recurrent atrial fibrillation. She's had no bleeding complications. She recently had a minor fall and does have some bruising on her right shin but did not hit her head. She generally feels fairly stable and does use a cane for extra support. She reports the swelling has improved with the addition of a diuretic.  I had the pleasure of seeing Chelsea Phillips back in the office today. Overall she seems to be doing fairly well. She unfortunately had one fall which was related to dizziness where she was getting into her car and fell in her car port. She did hit her head on the concrete. She was to get emergency department but did not undergo CT scanning, despite being on Eliquis. Fortunately there was no intracranial bleed. Subsequently she's had no further falls. She's had no bleeding episodes  related to Eliquis. Heart rate seems to be well-controlled and she is in sinus rhythm (or a low atrial rhythm) today.  03/09/2016  Chelsea Phillips was seen today in follow-up. Over the past year she's done fairly well. In fact she's had no bleeding problems on her Eliquis. She denies any worsening shortness of breath or chest pain. In fact there is no atrial fibrillation noted today on her EKG. Blood pressure is well-controlled. She is on low-dose niacin for cholesterol although she would not tolerate increased doses of that due to flushing. I did review the most recent data with her regarding niacin and the fact that it is likely minimally effective for cholesterol. Given her age and the fact this is being used for primary prevention, I advised her to discontinue that today as there is little benefit.  02/22/2017  Chelsea Phillips was seen today in follow-up. Her only issue is some ongoing increased urination at night. She seen urology and been on a number of medications with no benefit. She denies any recurrent A. fib. She has no bleeding problems on Eliquis. Her blood pressures monitored at home and shows very good control. She is on some thyroid medication monitor by her primary. She takes low-dose amlodipine as well as metoprolol for blood pressure. She denies chest pain or worsening shortness of breath.  01/15/2019  Chelsea Phillips is seen today in follow-up.  Overall her daughter says she is doing well physically however mentally she has had significant memory loss.  She recently started  on donepezil.  Her blood pressures been well controlled.  She denies any chest pain or shortness of breath.  She has had no recurrent A. fib.  Unfortunately she ran out of her Eliquis.  She does not need any refills with that.  She denies any falls and her daughter says primarily she uses a rolling walker is in a wheelchair.  I reviewed labs from July 14 (about a week and a half ago) which shows a creatinine of 1.6.  This  is increased from her creatinine which was 1.29 in January 2020.  Given her age and creatinine over 1.5, her dose of Eliquis should be adjusted to 2.5.  Is not clear what the etiology of the elevated creatinine is.  May be dehydration, however there are no medication she takes which would be responsible for this.  09/12/2019  Chelsea Phillips is seen today in follow-up. Her main complaints have to do with sinus drainage. In the past she had had an elevated creatinine over 1.6 however more recently it came down to normal. I had advised changing her Eliquis dose from 5 to 2.5 mg twice daily however she stopped the Eliquis completely and has been off of it for months. She denies any chest pain or worsening shortness of breath. She has had no further falls. She is in a sinus rhythm today. Blood pressure is noted to be low at 93/60. Her daughter could not tell me whether this was a consistent blood pressure reading or just low today. She did take some hydrocodone this morning because of pain.  PMHx:  Past Medical History:  Diagnosis Date  . Arthritis   . Diverticulosis of colon (without mention of hemorrhage)   . Esophageal reflux   . ESOPHAGEAL STRICTURE 09/14/2007   Qualifier: Diagnosis of  By: Ronnald Ramp RN, [place], [place]    . Hiatal hernia   . History of atrial fibrillation    with RVR (hopsitalized 12/2011) - converted spontanesouly  . Hypertension   . Hypothyroidism   . Lumbar disc disease   . Stricture and stenosis of esophagus   . Venous insufficiency     Past Surgical History:  Procedure Laterality Date  . ABDOMINAL HYSTERECTOMY    . APPENDECTOMY    . TRANSTHORACIC ECHOCARDIOGRAM  06/19/2012   EF 0000000, grade 1 diastolic dysfunction; mild AV regurg; mild MR, calcified MV annulus; mild AI; normal PA pressure     FAMHx:  Family History  Problem Relation Age of Onset  . Heart disease Mother   . Leukemia Sister   . Heart disease Sister   . Alzheimer's disease Sister     SOCHx:    reports that she has never smoked. She has quit using smokeless tobacco.  Her smokeless tobacco use included snuff. She reports that she does not drink alcohol or use drugs.  ALLERGIES:  Allergies  Allergen Reactions  . Niacor [Niacin]     flushing    ROS: Pertinent items noted in HPI and remainder of comprehensive ROS otherwise negative.  HOME MEDS: Current Outpatient Medications  Medication Sig Dispense Refill  . amLODipine (NORVASC) 5 MG tablet Take 1 tablet (5 mg total) by mouth daily. 90 tablet 2  . apixaban (ELIQUIS) 2.5 MG TABS tablet Take 1 tablet (2.5 mg total) by mouth 2 (two) times daily. (Patient taking differently: Take 5 mg by mouth daily. ) 180 tablet 3  . clotrimazole-betamethasone (LOTRISONE) cream Apply 1 application topically 2 (two) times daily. 45 g 2  . HYDROcodone-acetaminophen (NORCO/VICODIN)  5-325 MG tablet Take 1 tablet by mouth every 6 (six) hours as needed for severe pain (osteoarthritis knees). 120 tablet 0  . levothyroxine (SYNTHROID, LEVOTHROID) 75 MCG tablet Take 75 mcg by mouth daily before breakfast.    . metoprolol succinate (TOPROL-XL) 100 MG 24 hr tablet Take 100 mg by mouth daily.     No current facility-administered medications for this visit.    LABS/IMAGING: No results found for this or any previous visit (from the past 48 hour(s)). No results found.  VITALS: BP 93/60   Pulse 75   Temp 97.7 F (36.5 C)   Ht 5' (1.524 m)   Wt 165 lb (74.8 kg)   SpO2 92%   BMI 32.22 kg/m   EXAM: General appearance: alert and no distress Neck: no carotid bruit, no JVD and thyroid not enlarged, symmetric, no tenderness/mass/nodules Lungs: clear to auscultation bilaterally Heart: regular rate and rhythm, S1, S2 normal, no murmur, click, rub or gallop Abdomen: soft, non-tender; bowel sounds normal; no masses,  no organomegaly Extremities: extremities normal, atraumatic, no cyanosis or edema Pulses: 2+ and symmetric Skin: Skin color, texture, turgor  normal. No rashes or lesions Neurologic: Grossly normal Psych: Pleasant  EKG: Normal sinus rhythm 75, low voltage QRS-personally reviewed  ASSESSMENT: 1. Paroxysmal atrial fibrillation - CHADSVASC 4 , non-compliant with Eliquis 2. Hypertension 3. Leg edema - improved 4. Hypothyroidism 5. GERD   PLAN: 1.   Mrs. Dietzen does not seem to be in A. fib today however she has been noncompliant with Eliquis. Her renal function has improved to the point where if she were to restarted I recommend the 5 mg twice daily dose. I will put that through today for her to start taking. Her daughter should continue to monitor for falls or high fall risk. Blood pressure was low today and I advised them to monitor it over the next week or so and report back to [place]. Plan follow-up annually or sooner as necessary.  Pixie Casino, MD, [facility], [place] Director of the Advanced Lipid Disorders &  Cardiovascular Risk Reduction Clinic Diplomate of the American Board of Clinical Lipidology Attending Cardiologist  Direct Dial: [phone]  Fax: [phone]  Website:  [email or web address] 09/12/2019, 11:44 AM

## 2019-09-12 NOTE — Patient Instructions (Signed)
Medication Instructions:  Your physician recommends that you continue on your current medications as directed. Please refer to the Current Medication list given to you today.  *If you need a refill on your cardiac medications before your next appointment, please call your pharmacy*   Follow-Up: At [facility], you and your health needs are our priority.  As part of our continuing mission to provide you with exceptional heart care, we have created designated Provider Care Teams.  These Care Teams include your primary Cardiologist (physician) and Advanced Practice Providers (APPs -  Physician Assistants and Nurse Practitioners) who all work together to provide you with the care you need, when you need it.  We recommend signing up for the patient portal called "MyChart".  Sign up information is provided on this After Visit Summary.  MyChart is used to connect with patients for Virtual Visits (Telemedicine).  Patients are able to view lab/test results, encounter notes, upcoming appointments, etc.  Non-urgent messages can be sent to your provider as well.   To learn more about what you can do with MyChart, go to [email or web address].    Your next appointment:   12 month(s)  The format for your next appointment:   In Person  Provider:   You may see Dr. Debara Pickett or one of the following Advanced Practice Providers on your designated Care Team:    Almyra Deforest, PA-C  Fabian Sharp, PA-C or   Roby Lofts, [place]    Other Instructions  Please check your BP at home twice daily for about 1 week. Please call with readings

## 2019-09-23 ENCOUNTER — Other Ambulatory Visit: Payer: Self-pay | Admitting: Internal Medicine

## 2019-09-27 ENCOUNTER — Telehealth: Payer: Self-pay | Admitting: Family Medicine

## 2019-09-27 NOTE — Progress Notes (Signed)
  Chronic Care Management   Outreach Note  09/27/2019 Name: DARRIANNA BLONDER MRN: [id number] DOB: 1921-08-07  Referred by: Rochel Brome, MD Reason for referral : No chief complaint on file.   An unsuccessful telephone outreach was attempted today. The patient was referred to the pharmacist for assistance with care management and care coordination.   Follow Up Plan:   Earney [place] Upstream Scheduler

## 2019-10-04 ENCOUNTER — Telehealth: Payer: Self-pay | Admitting: Family Medicine

## 2019-10-04 NOTE — Progress Notes (Signed)
°  Chronic Care Management   Note  10/04/2019 Name: Chelsea Phillips MRN: [id number] DOB: [id number]  Chelsea Phillips is a 86 y.o. year old female who is a primary care patient of Cox, Kirsten, MD. I reached out to [employer] by phone today in response to a referral sent by Chelsea Phillips's PCP, Cox, Kirsten, MD.   Chelsea Phillips was given information about Chronic Care Management services today including:  1. CCM service includes personalized support from designated clinical staff supervised by her physician, including individualized plan of care and coordination with other care providers 2. 24/7 contact phone numbers for assistance for urgent and routine care needs. 3. Service will only be billed when office clinical staff spend 20 minutes or more in a month to coordinate care. 4. Only one practitioner may furnish and bill the service in a calendar month. 5. The patient may stop CCM services at any time (effective at the end of the month) by phone call to the office staff.   Patient agreed to services and verbal consent obtained.   Follow up plan:   Chelsea Phillips Upstream Scheduler

## 2019-10-25 NOTE — Chronic Care Management (AMB) (Signed)
Chronic Care Management Pharmacy  Name: Chelsea Phillips  MRN: [id number] DOB: 1921/09/25  Chief Complaint/ HPI  [name],  86 y.o. , female presents for their Initial CCM visit with the clinical pharmacist via telephone due to COVID-19 Pandemic.  PCP : Chelsea Brome, MD  Their chronic conditions include: Afib, Esophageal reflux, secondary hypothyroidism, Alzheimer's disease, Osteoarthritis, HTN, CKD stage 3B, Urinary incontinence  Office Visits: 08/13/2019 - patient requesting refill of lotrisone for rash under breast. Stable bp.  Consult Visit: 09/12/2019 - [facility] - wanted to resume Eliquis dose at 5 mg bid but p. 06/04/2019 - fracture of medial malleolus left - continue brace and sneaker.    05/01/2019 - Closed displaced fracture of medial malleolus Medications: Outpatient Encounter Medications as of 10/26/2019  Medication Sig  . amLODipine (NORVASC) 5 MG tablet TAKE 1 TABLET BY MOUTH EVERY DAY  . apixaban (ELIQUIS) 2.5 MG TABS tablet Take 2 tablets (5 mg total) by mouth daily. (Patient taking differently: Take 2.5 mg by mouth 2 (two) times daily. )  . clotrimazole-betamethasone (LOTRISONE) cream Apply 1 application topically 2 (two) times daily.  Marland Kitchen HYDROcodone-acetaminophen (NORCO/VICODIN) 5-325 MG tablet Take 1 tablet by mouth every 6 (six) hours as needed for severe pain (osteoarthritis knees).  Marland Kitchen levothyroxine (SYNTHROID, LEVOTHROID) 75 MCG tablet Take 75 mcg by mouth daily before breakfast.  . loratadine (CLARITIN) 10 MG tablet Take 10 mg by mouth daily.  . metoprolol succinate (TOPROL-XL) 100 MG 24 hr tablet Take 50 mg by mouth in the morning and at bedtime.    No facility-administered encounter medications on file as of 10/26/2019.   Allergies  Allergen Reactions  . Niacor [Niacin]     flushing   SDOH Screenings   Alcohol Screen:   . Last Alcohol Screening Score (AUDIT):   Depression (PHQ2-9): Low Risk   . PHQ-2 Score: 0  Financial  Resource Strain:   . Difficulty of Paying Living Expenses:   Food Insecurity:   . Worried About Charity fundraiser in the Last Year:   . [place] in the Last Year:   Housing:   . Last Housing Risk Score:   Physical Activity:   . Days of Exercise per Week:   . Minutes of Exercise per Session:   Social Connections:   . Frequency of Communication with Friends and Family:   . Frequency of Social Gatherings with Friends and Family:   . Attends Religious Services:   . Active Member of Clubs or Organizations:   . Attends Archivist Meetings:   Marland Kitchen Marital Status:   Stress:   . Feeling of Stress :   Tobacco Use: Medium Risk  . Smoking Tobacco Use: Never Smoker  . Smokeless Tobacco Use: Former Soil scientist Needs:   . Film/video editor (Medical):   Marland Kitchen Lack of Transportation (Non-Medical):      Current Diagnosis/Assessment:  Goals Addressed            This Visit's Progress   . Pharmacy Care Plan       CARE PLAN ENTRY  Current Barriers:  . Chronic Disease Management support, education, and care coordination needs related to Hypertension and Atrial Fibrillation   Hypertension . Pharmacist Clinical Goal(s): o Over the next 90 days, patient will work with PharmD and providers to maintain BP goal <140/90 . Current regimen:  o Amlodipine 5 mg daily o Metoprolol Succinate 100 mg daily . Interventions: o Recommend continuing monitoring sodium  intake and blood pressure to protect kidney function.  . Patient self care activities - Over the next 180 days, patient will: o Check BP weekly, document, and provide at future appointments o Ensure daily salt intake < 2300 mg/day  Atrial Fibrillation . Pharmacist Clinical Goal(s) o Over the next 90 days, patient will work with PharmD and providers to continue good control of Atrial Fibrillation. . Current regimen:  o Metoprolol succinate 100 mg daily  o Eliquis 2.5 mg twice daily . Interventions: o Recommend  continuing to take medication as prescribed each day.  . Patient self care activities - Over the next 180 days, patient will: o Continuing to take medications as prescribed and monitor for any symptoms of uncontrolled Atrial Fibrillation.   Medication management . Pharmacist Clinical Goal(s): o Over the next 90 days, patient will work with PharmD and providers to maintain optimal medication adherence . [facility] . Interventions o Comprehensive medication review performed. o Continue current medication management strategy . Patient self care activities - Over the next 180 days, patient will: o Focus on medication adherence by continuing to utilize pill box.  o Take medications as prescribed o Report any questions or concerns to PharmD and/or provider(s)  Initial goal documentation        AFIB   Lab Results  Component Value Date   CREATININE 1.13 (H) 08/13/2019   CREATININE 1.29 (H) 04/14/2016   CREATININE 1.55 (H) 03/24/2016     Patient is currently rate controlled. HR 75 BPM  Patient has failed these meds in past: n/a Patient is currently controlled on the following medications: metoprolol succinate 100 mg daily, eliquis 2.5 mg bid  We discussed: Monitoring for symptoms of Afib. Patient is well managed currently. Cardiology manages.    Plan  Continue current medications  ,  Hypertension   BP today is:  <130/80  Office blood pressures are  BP Readings from Last 3 Encounters:  09/12/19 93/60  08/13/19 124/70  01/15/19 117/70    Patient has failed these meds in the past: losartan, losartan-hctz Patient is currently controlled on the following medications: amlodipine 5 mg daily  Patient checks BP at home infrequently - machine needs new batteries  Patient home BP readings are ranging: 120/70 mmHg  We discussed diet and exercise extensively. Patient live with her daughter. Patient's daughter reports that patient sleeps much of the time. Patient's  daughter plans to get new batteries for blood pressure machine to be able to monitor bp as needed.   Plan  Continue current medications   Hypothyroidism   TSH  Date Value Ref Range Status  08/13/2019 2.820 0.450 - 4.500 uIU/mL Final     Patient has failed these meds in past: n/a Patient is currently controlled on the following medications: levothyroxine 75 mcg daily  We discussed: Patient sleeps much of the time but otherwise seems to be under good control.   Plan  Continue current medications     and  Other Diagnosis: Osteoarthritis of Knees    Patient has failed these meds in past: lidoderm patch Patient is currently controlled on the following medications: hydrocodone-apap 5-325 mg q6h prn.   We discussed: Patient has some pain and using hydrocodone as needed. Patient is not a surgical candidate and hydrocodone helps manage pain.   Plan  Continue current medications   Health Maintenance   Patient is currently controlled on the following medications:  lotrisone cream - intertrigo We discussed:  Patient's daughter reports that the lotrisone cream  is clearing up the rash really well.   Plan  Continue current medications   Vaccines   Reviewed and discussed patient's vaccination history.    Immunization History  Administered Date(s) Administered  . Influenza, High Dose Seasonal PF 05/01/2019  . Influenza,inj,Quad PF,6+ Mos 04/14/2016  . Moderna SARS-COVID-2 Vaccination 08/02/2019  . Td 06/22/2003    Plan  Recommended patient receive annual flu vaccine in office.   Medication Management   [facility] Uses pill box? Yes Pt endorses 100% compliance  We discussed: Daughter manages medications and fills up pill box. Previously patient had missed doses of Eliquis when still managing herself. Patient lives with daughter. Daughter manages patients care.   Plan  Continue current medication management strategy    Follow up:  6 month phone visit

## 2019-10-26 ENCOUNTER — Ambulatory Visit: Payer: Medicare Other

## 2019-10-26 ENCOUNTER — Other Ambulatory Visit: Payer: Self-pay

## 2019-10-26 DIAGNOSIS — I482 Chronic atrial fibrillation, unspecified: Secondary | ICD-10-CM

## 2019-10-26 DIAGNOSIS — I129 Hypertensive chronic kidney disease with stage 1 through stage 4 chronic kidney disease, or unspecified chronic kidney disease: Secondary | ICD-10-CM

## 2019-10-27 NOTE — Patient Instructions (Addendum)
Visit Information  Thank you for your time discussing your medications. I look forward to working with you to achieve your health care goals. Below is a summary of what we talked about during our visit.   Goals Addressed            This Visit's Progress   . Pharmacy Care Plan       CARE PLAN ENTRY  Current Barriers:  . Chronic Disease Management support, education, and care coordination needs related to Hypertension and Atrial Fibrillation   Hypertension . Pharmacist Clinical Goal(s): o Over the next 90 days, patient will work with PharmD and providers to maintain BP goal <140/90 . Current regimen:  o Amlodipine 5 mg daily o Metoprolol Succinate 100 mg daily . Interventions: o Recommend continuing monitoring sodium intake and blood pressure to protect kidney function.  . Patient self care activities - Over the next 180 days, patient will: o Check BP weekly, document, and provide at future appointments o Ensure daily salt intake < 2300 mg/day  Atrial Fibrillation . Pharmacist Clinical Goal(s) o Over the next 90 days, patient will work with PharmD and providers to continue good control of Atrial Fibrillation. . Current regimen:  o Metoprolol succinate 100 mg daily  o Eliquis 2.5 mg twice daily . Interventions: o Recommend continuing to take medication as prescribed each day.  . Patient self care activities - Over the next 180 days, patient will: o Continuing to take medications as prescribed and monitor for any symptoms of uncontrolled Atrial Fibrillation.   Medication management . Pharmacist Clinical Goal(s): o Over the next 90 days, patient will work with PharmD and providers to maintain optimal medication adherence . [facility] . Interventions o Comprehensive medication review performed. o Continue current medication management strategy . Patient self care activities - Over the next 180 days, patient will: o Focus on medication adherence by continuing to  utilize pill box.  o Take medications as prescribed o Report any questions or concerns to PharmD and/or provider(s)  Initial goal documentation        Chelsea Phillips was given information about Chronic Care Management services today including:  1. CCM service includes personalized support from designated clinical staff supervised by her physician, including individualized plan of care and coordination with other care providers 2. 24/7 contact phone numbers for assistance for urgent and routine care needs. 3. Standard insurance, coinsurance, copays and deductibles apply for chronic care management only during months in which we provide at least 20 minutes of these services. Most insurances cover these services at 100%, however patients may be responsible for any copay, coinsurance and/or deductible if applicable. This service may help you avoid the need for more expensive face-to-face services. 4. Only one practitioner may furnish and bill the service in a calendar month. 5. The patient may stop CCM services at any time (effective at the end of the month) by phone call to the office staff.  Patient agreed to services and verbal consent obtained.   The patient verbalized understanding of instructions provided today and agreed to receive a mailed copy of patient instruction and/or educational materials. Telephone follow up appointment with pharmacy team member scheduled for:04/28/2020 at 72 am   Chelsea Phillips, PharmD Clinical Pharmacist [facility] [phone] Atrial Fibrillation  Atrial fibrillation is a type of heartbeat that is irregular or fast. If you have this condition, your heart beats without any order. This makes it hard for your heart to pump blood in a  normal way. Atrial fibrillation may come and go, or it may become a long-lasting problem. If this condition is not treated, it can put you at higher risk for stroke, heart failure, and other heart problems. What are the  causes? This condition may be caused by diseases that damage the heart. They include:  High blood pressure.  Heart failure.  Heart valve disease.  Heart surgery. Other causes include:  Diabetes.  Thyroid disease.  Being overweight.  Kidney disease. Sometimes the cause is not known. What increases the risk? You are more likely to develop this condition if:  You are older.  You smoke.  You exercise often and very hard.  You have a family history of this condition.  You are a man.  You use drugs.  You drink a lot of alcohol.  You have lung conditions, such as emphysema, pneumonia, or COPD.  You have sleep apnea. What are the signs or symptoms? Common symptoms of this condition include:  A feeling that your heart is beating very fast.  Chest pain or discomfort.  Feeling short of breath.  Suddenly feeling light-headed or weak.  Getting tired easily during activity.  Fainting.  Sweating. In some cases, there are no symptoms. How is this treated? Treatment for this condition depends on underlying conditions and how you feel when you have atrial fibrillation. They include:  Medicines to: ? Prevent blood clots. ? Treat heart rate or heart rhythm problems.  Using devices, such as a pacemaker, to correct heart rhythm problems.  Doing surgery to remove the part of the heart that sends bad signals.  Closing an area where clots can form in the heart (left atrial appendage). In some cases, your doctor will treat other underlying conditions. Follow these instructions at home: Medicines  Take over-the-counter and prescription medicines only as told by your doctor.  Do not take any new medicines without first talking to your doctor.  If you are taking blood thinners: ? Talk with your doctor before you take any medicines that have aspirin or NSAIDs, such as ibuprofen, in them. ? Take your medicine exactly as told by your doctor. Take it at the same time  each day. ? Avoid activities that could hurt or bruise you. Follow instructions about how to prevent falls. ? Wear a bracelet that says you are taking blood thinners. Or, carry a card that lists what medicines you take. Lifestyle      Do not use any products that have nicotine or tobacco in them. These include cigarettes, e-cigarettes, and chewing tobacco. If you need help quitting, ask your doctor.  Eat heart-healthy foods. Talk with your doctor about the right eating plan for you.  Exercise regularly as told by your doctor.  Do not drink alcohol.  Lose weight if you are overweight.  Do not use drugs, including cannabis. General instructions  If you have a condition that causes breathing to stop for a short period of time (apnea), treat it as told by your doctor.  Keep a healthy weight. Do not use diet pills unless your doctor says they are safe for you. Diet pills may make heart problems worse.  Keep all follow-up visits as told by your doctor. This is important. Contact a doctor if:  You notice a change in the speed, rhythm, or strength of your heartbeat.  You are taking a blood-thinning medicine and you get more bruising.  You get tired more easily when you move or exercise.  You have a sudden  change in weight. Get help right away if:   You have pain in your chest or your belly (abdomen).  You have trouble breathing.  You have side effects of blood thinners, such as blood in your vomit, poop (stool), or pee (urine), or bleeding that cannot stop.  You have any signs of a stroke. "BE FAST" is an easy way to remember the main warning signs: ? B - Balance. Signs are dizziness, sudden trouble walking, or loss of balance. ? E - Eyes. Signs are trouble seeing or a change in how you see. ? F - Face. Signs are sudden weakness or loss of feeling in the face, or the face or eyelid drooping on one side. ? A - Arms. Signs are weakness or loss of feeling in an arm. This happens  suddenly and usually on one side of the body. ? S - Speech. Signs are sudden trouble speaking, slurred speech, or trouble understanding what people say. ? T - Time. Time to call emergency services. Write down what time symptoms started.  You have other signs of a stroke, such as: ? A sudden, very bad headache with no known cause. ? Feeling like you may vomit (nausea). ? Vomiting. ? A seizure. These symptoms may be an emergency. Do not wait to see if the symptoms will go away. Get medical help right away. Call your local emergency services (911 in the [place] Do not drive yourself to the hospital. Summary  Atrial fibrillation is a type of heartbeat that is irregular or fast.  You are at higher risk of this condition if you smoke, are older, have diabetes, or are overweight.  Follow your doctor's instructions about medicines, diet, exercise, and follow-up visits.  Get help right away if you have signs or symptoms of a stroke.  Get help right away if you cannot catch your breath, or you have chest pain or discomfort. This information is not intended to replace advice given to you by your health care provider. Make sure you discuss any questions you have with your health care provider. Document Revised: 11/29/2018 Document Reviewed: 11/29/2018 Elsevier Patient Education  [place].  Chronic Kidney Disease, Adult Chronic kidney disease (CKD) happens when the kidneys are damaged over a long period of time. The kidneys are two organs that help with:  Getting rid of waste and extra fluid from the blood.  Making hormones that maintain the amount of fluid in your tissues and blood vessels.  Making sure that the body has the right amount of fluids and chemicals. Most of the time, CKD does not go away, but it can usually be controlled. Steps must be taken to slow down the kidney damage or to stop it from getting worse. If this is not done, the kidneys may stop working. Follow these  instructions at home: Medicines  Take over-the-counter and prescription medicines only as told by your doctor. You may need to change the amount of medicines you take.  Do not take any new medicines unless your doctor says it is okay. Many medicines can make your kidney damage worse.  Do not take any vitamin and supplements unless your doctor says it is okay. Many vitamins and supplements can make your kidney damage worse. General instructions  Follow a diet as told by your doctor. You may need to stay away from: ? Alcohol. ? Salty foods. ? Foods that are high in:  Potassium.  Calcium.  Protein.  Do not use any products that contain  nicotine or tobacco, such as cigarettes and e-cigarettes. If you need help quitting, ask your doctor.  Keep track of your blood pressure at home. Tell your doctor about any changes.  If you have diabetes, keep track of your blood sugar as told by your doctor.  Try to stay at a healthy weight. If you need help, ask your doctor.  Exercise at least 30 minutes a day, 5 days a week.  Stay up-to-date with your shots (immunizations) as told by your doctor.  Keep all follow-up visits as told by your doctor. This is important. Contact a doctor if:  Your symptoms get worse.  You have new symptoms. Get help right away if:  You have symptoms of end-stage kidney disease. These may include: ? Headaches. ? Numbness in your hands or feet. ? Easy bruising. ? Having hiccups often. ? Chest pain. ? Shortness of breath. ? Stopping of menstrual periods in women.  You have a fever.  You have very little pee (urine).  You have pain or bleeding when you pee. Summary  Chronic kidney disease (CKD) happens when the kidneys are damaged over a long period of time.  Most of the time, this condition does not go away, but it can usually be controlled. Steps must be taken to slow down the kidney damage or to stop it from getting worse.  Treatment may include a  combination of medicines and lifestyle changes. This information is not intended to replace advice given to you by your health care provider. Make sure you discuss any questions you have with your health care provider. Document Revised: 05/20/2017 Document Reviewed: 07/12/2016 Elsevier Patient Education  2020 [employer].

## 2019-11-08 ENCOUNTER — Other Ambulatory Visit: Payer: Self-pay

## 2019-11-08 DIAGNOSIS — I129 Hypertensive chronic kidney disease with stage 1 through stage 4 chronic kidney disease, or unspecified chronic kidney disease: Secondary | ICD-10-CM

## 2019-11-08 DIAGNOSIS — E039 Hypothyroidism, unspecified: Secondary | ICD-10-CM

## 2019-11-08 NOTE — Progress Notes (Signed)
Patient had an appt with Sherre Poot, PharmD on 10/26/2019.

## 2019-11-13 NOTE — Progress Notes (Signed)
Subjective:  Patient ID: Chelsea Phillips, female    DOB: [birth year]  Age: 86 y.o. MRN: [id number]  Chief Complaint  Patient presents with  . Gastroesophageal Reflux  . Hypertension  . Hyperlipidemia  . Hypothyroidism    HPI  Patient is a 86 year old white female who presents for follow-up of hypertension, atrial fibrillation, hyperlipidemia, GERD, osteoarthritis of her knees, and hypothyroidism.  Patient eats fairly healthy.  She is unable to exercise due to her osteoarthritis of her knees.  She takes hydrocodone acetaminophen 5/325 1- 4 times daily as needed severe knee pain.   She has atrial fibrillation for which she sees Dr. Debara Pickett, cardiology, and is treated with Eliquis 5 mg half pill twice daily, metoprolol succinate extended release 100 mg 1 daily.   In addition, for hypertension she takes amlodipine 5 mg once daily.  She has had some late onset of Alzheimer's dementia and is currently on donepezil.  Per her caretaker her status is stable.  Social History   Socioeconomic History  . Marital status: Widowed    Spouse name: Not on file  . Number of children: 3  . Years of education: 2  . Highest education level: Not on file  Occupational History  . Occupation: Retired  Tobacco Use  . Smoking status: Never Smoker  . Smokeless tobacco: Former Systems developer    Types: Snuff  Substance and Sexual Activity  . Alcohol use: No  . Drug use: No  . Sexual activity: Not on file  Other Topics Concern  . Not on file  Social History Narrative   wears sunscreen, brushes and flosses daily, see's dentist bi-annually, has smoke/carbon monoxide detectors, wears a seatbelt and practices gun safety   Social Determinants of Health   Financial Resource Strain:   . Difficulty of Paying Living Expenses:   Food Insecurity:   . Worried About Charity fundraiser in the Last Year:   . Arboriculturist in the Last Year:   Transportation Needs:   . Film/video editor (Medical):   Marland Kitchen Lack of  Transportation (Non-Medical):   Physical Activity:   . Days of Exercise per Week:   . Minutes of Exercise per Session:   Stress:   . Feeling of Stress :   Social Connections:   . Frequency of Communication with Friends and Family:   . Frequency of Social Gatherings with Friends and Family:   . Attends Religious Services:   . Active Member of Clubs or Organizations:   . Attends Archivist Meetings:   Marland Kitchen Marital Status:    Past Medical History:  Diagnosis Date  . Arthritis   . Diverticulosis of colon (without mention of hemorrhage)   . Esophageal reflux   . ESOPHAGEAL STRICTURE 09/14/2007   Qualifier: Diagnosis of  By: Ronnald Ramp RN, [place], [place]    . Hiatal hernia   . History of atrial fibrillation    with RVR (hopsitalized 12/2011) - converted spontanesouly  . Hypertension   . Hypothyroidism   . Lumbar disc disease   . Stricture and stenosis of esophagus   . Venous insufficiency    Family History  Problem Relation Age of Onset  . Heart disease Mother   . Leukemia Sister   . Heart disease Sister   . Alzheimer's disease Sister     Review of Systems  Constitutional: Negative for chills, fatigue and fever.  HENT: Positive for rhinorrhea. Negative for congestion, ear pain and sore throat.   Respiratory: Negative for  cough and shortness of breath.   Cardiovascular: Negative for chest pain, palpitations and leg swelling.  Gastrointestinal: Negative for abdominal pain, constipation, diarrhea, nausea and vomiting.  Endocrine: Positive for polyuria. Negative for polydipsia and polyphagia.  Genitourinary: Negative for dysuria and urgency.  Musculoskeletal: Positive for arthralgias. Negative for back pain and myalgias.  Neurological: Negative for dizziness, weakness, light-headedness and headaches.  Psychiatric/Behavioral: Negative for dysphoric mood. The patient is not nervous/anxious.      Objective:  BP 130/80   Pulse 64   Temp (!) 97.4 F (36.3 C)   Resp 16   Ht 5'  (1.524 m)   Wt 164 lb (74.4 kg)   BMI 32.03 kg/m   BP/Weight 11/14/2019 09/12/2019 [id number]  Systolic BP [id number]  Diastolic BP 80 60 70  Wt. (Lbs) 164 165 -  BMI 32.03 32.22 -    Physical Exam Vitals reviewed.  Constitutional:      Appearance: Normal appearance. She is normal weight.     Comments: In wheelchair.  Cardiovascular:     Rate and Rhythm: Normal rate and regular rhythm.     Pulses: Normal pulses.     Heart sounds: Normal heart sounds.  Pulmonary:     Effort: Pulmonary effort is normal. No respiratory distress.     Breath sounds: Normal breath sounds.  Abdominal:     General: Abdomen is flat. Bowel sounds are normal.     Palpations: Abdomen is soft.     Tenderness: There is no abdominal tenderness.  Musculoskeletal:     Comments: Brace on left ankle. In wheelchair.   Neurological:     Mental Status: She is alert and oriented to person, place, and time.  Psychiatric:        Mood and Affect: Mood normal.        Behavior: Behavior normal.    Fall Risk  11/14/2019 08/26/2019  Falls in the past year? 0 1  Number falls in past yr: 0 1  Injury with Fall? - 1  Risk for fall due to : - Orthopedic patient;Impaired mobility;Impaired balance/gait;History of fall(s)  Follow up - Falls evaluation completed;Falls prevention discussed    Lab Results  Component Value Date   WBC 8.5 11/14/2019   HGB 15.0 11/14/2019   HCT 45.2 11/14/2019   PLT 201 11/14/2019   GLUCOSE 97 11/14/2019   CHOL 180 11/14/2019   TRIG 108 11/14/2019   HDL 52 11/14/2019   LDLCALC 109 (H) 11/14/2019   ALT 6 11/14/2019   AST 16 11/14/2019   NA 141 11/14/2019   K 4.2 11/14/2019   CL 103 11/14/2019   CREATININE 1.06 (H) 11/14/2019   BUN 22 11/14/2019   CO2 22 11/14/2019   TSH 3.530 11/14/2019   INR 1.9 (A) 04/12/2013      Assessment & Plan:  1. Hypertension, renal disease, stage 1-4 or unspecified chronic kidney disease Well controlled.  No changes to medicines.  Continue to work on  eating a healthy diet and exercise.  Labs drawn today.  - Comprehensive metabolic panel - CBC with Differential/Platelet  2. Chronic pain syndrome Due to BL osteoarthritis of her knees. The current medical regimen is effective;  continue present plan and medications. - HYDROcodone-acetaminophen (NORCO/VICODIN) 5-325 MG tablet; Take 1 tablet by mouth every 6 (six) hours as needed for severe pain (osteoarthritis knees).  Dispense: 120 tablet; Refill: 0  3. Uncomplicated opioid dependence (HCC) - HYDROcodone-acetaminophen (NORCO/VICODIN) 5-325 MG tablet; Take 1 tablet by mouth every 6 (  six) hours as needed for severe pain (osteoarthritis knees).  Dispense: 120 tablet; Refill: 0  4. Secondary hypothyroidism - TSH  5. Primary osteoarthritis of both knees Severe. Not a candidate for surgery. Failed steroid injection in past.  The current medical regimen is fairly effective;  continue present plan and medications.  6. Late onset Alzheimer's disease without behavioral disturbance ([place]) Consider starting aricept. Memory very mildly affected.   7. Mixed hyperlipidemia Well controlled.  No changes to medicines.  Continue to work on eating a healthy diet and exercise.  Labs drawn today.  - Lipid panel  8. Class 1 obesity due to excess calories with serious comorbidity and body mass index (BMI) of 32.0 to 32.9 in adult Recommend continue to work on eating healthy diet and exercise.  Meds ordered this encounter  Medications  . apixaban (ELIQUIS) 2.5 MG TABS tablet    Sig: Take 1 tablet (2.5 mg total) by mouth 2 (two) times daily.    Dispense:  60 tablet    Refill:  3  . HYDROcodone-acetaminophen (NORCO/VICODIN) 5-325 MG tablet    Sig: Take 1 tablet by mouth every 6 (six) hours as needed for severe pain (osteoarthritis knees).    Dispense:  120 tablet    Refill:  0       Follow-up: Return in about 3 months (around 02/14/2020).  AVS was given to patient prior to departure.  Rochel Brome [facility] [phone]

## 2019-11-14 ENCOUNTER — Ambulatory Visit (INDEPENDENT_AMBULATORY_CARE_PROVIDER_SITE_OTHER): Payer: Medicare Other | Admitting: Family Medicine

## 2019-11-14 ENCOUNTER — Other Ambulatory Visit: Payer: Self-pay

## 2019-11-14 ENCOUNTER — Encounter: Payer: Self-pay | Admitting: Family Medicine

## 2019-11-14 VITALS — BP 130/80 | HR 64 | Temp 97.4°F | Resp 16 | Ht 60.0 in | Wt 164.0 lb

## 2019-11-14 DIAGNOSIS — I129 Hypertensive chronic kidney disease with stage 1 through stage 4 chronic kidney disease, or unspecified chronic kidney disease: Secondary | ICD-10-CM

## 2019-11-14 DIAGNOSIS — F0280 Dementia in other diseases classified elsewhere without behavioral disturbance: Secondary | ICD-10-CM

## 2019-11-14 DIAGNOSIS — M170 Bilateral primary osteoarthritis of knee: Secondary | ICD-10-CM

## 2019-11-14 DIAGNOSIS — E782 Mixed hyperlipidemia: Secondary | ICD-10-CM

## 2019-11-14 DIAGNOSIS — Z6832 Body mass index (BMI) 32.0-32.9, adult: Secondary | ICD-10-CM

## 2019-11-14 DIAGNOSIS — E038 Other specified hypothyroidism: Secondary | ICD-10-CM | POA: Diagnosis not present

## 2019-11-14 DIAGNOSIS — E6609 Other obesity due to excess calories: Secondary | ICD-10-CM

## 2019-11-14 DIAGNOSIS — F1120 Opioid dependence, uncomplicated: Secondary | ICD-10-CM | POA: Diagnosis not present

## 2019-11-14 DIAGNOSIS — G894 Chronic pain syndrome: Secondary | ICD-10-CM

## 2019-11-14 DIAGNOSIS — G301 Alzheimer's disease with late onset: Secondary | ICD-10-CM

## 2019-11-14 MED ORDER — HYDROCODONE-ACETAMINOPHEN 5-325 MG PO TABS: 1.0000 | ORAL_TABLET | Freq: Four times a day (QID) | ORAL | 0 refills | Status: DC | PRN

## 2019-11-14 MED ORDER — APIXABAN 2.5 MG PO TABS: 2.5000 mg | ORAL_TABLET | Freq: Two times a day (BID) | ORAL | 3 refills | Status: AC

## 2019-11-15 LAB — COMPREHENSIVE METABOLIC PANEL
ALT: 6 IU/L (ref 0–32)
AST: 16 IU/L (ref 0–40)
Albumin/Globulin Ratio: 1.6 (ref 1.2–2.2)
Albumin: 4.1 g/dL (ref 3.5–4.6)
Alkaline Phosphatase: 73 IU/L (ref 48–121)
BUN/Creatinine Ratio: 21 (ref 12–28)
BUN: 22 mg/dL (ref 10–36)
Bilirubin Total: 0.5 mg/dL (ref 0.0–1.2)
CO2: 22 mmol/L (ref 20–29)
Calcium: 9.2 mg/dL (ref 8.7–10.3)
Chloride: 103 mmol/L (ref 96–106)
Creatinine, Ser: 1.06 mg/dL — ABNORMAL HIGH (ref 0.57–1.00)
GFR calc Af Amer: 51 mL/min/{1.73_m2} — ABNORMAL LOW (ref >59)
GFR calc non Af Amer: 44 mL/min/{1.73_m2} — ABNORMAL LOW (ref >59)
Globulin, Total: 2.6 g/dL (ref 1.5–4.5)
Glucose: 97 mg/dL (ref 65–99)
Potassium: 4.2 mmol/L (ref 3.5–5.2)
Sodium: 141 mmol/L (ref 134–144)
Total Protein: 6.7 g/dL (ref 6.0–8.5)

## 2019-11-15 LAB — TSH: TSH: 3.53 u[IU]/mL (ref 0.450–4.500)

## 2019-11-15 LAB — CBC WITH DIFFERENTIAL/PLATELET
Basophils Absolute: 0.1 10*3/uL (ref 0.0–0.2)
Basos: 1 %
EOS (ABSOLUTE): 0.2 10*3/uL (ref 0.0–0.4)
Eos: 2 %
Hematocrit: 45.2 % (ref 34.0–46.6)
Hemoglobin: 15 g/dL (ref 11.1–15.9)
Immature Grans (Abs): 0 10*3/uL (ref 0.0–0.1)
Immature Granulocytes: 0 %
Lymphocytes Absolute: 3.4 10*3/uL — ABNORMAL HIGH (ref 0.7–3.1)
Lymphs: 40 %
MCH: 32.6 pg (ref 26.6–33.0)
MCHC: 33.2 g/dL (ref 31.5–35.7)
MCV: 98 fL — ABNORMAL HIGH (ref 79–97)
Monocytes Absolute: 0.7 10*3/uL (ref 0.1–0.9)
Monocytes: 8 %
Neutrophils Absolute: 4.1 10*3/uL (ref 1.4–7.0)
Neutrophils: 49 %
Platelets: 201 10*3/uL (ref 150–450)
RBC: 4.6 x10E6/uL (ref 3.77–5.28)
RDW: 12.6 % (ref 11.7–15.4)
WBC: 8.5 10*3/uL (ref 3.4–10.8)

## 2019-11-15 LAB — LIPID PANEL
Chol/HDL Ratio: 3.5 ratio (ref 0.0–4.4)
Cholesterol, Total: 180 mg/dL (ref 100–199)
HDL: 52 mg/dL (ref >39)
LDL Chol Calc (NIH): 109 mg/dL — ABNORMAL HIGH (ref 0–99)
Triglycerides: 108 mg/dL (ref 0–149)
VLDL Cholesterol Cal: 19 mg/dL (ref 5–40)

## 2019-11-15 LAB — CARDIOVASCULAR RISK ASSESSMENT

## 2019-12-02 ENCOUNTER — Encounter: Payer: Self-pay | Admitting: Family Medicine

## 2019-12-02 DIAGNOSIS — E782 Mixed hyperlipidemia: Secondary | ICD-10-CM | POA: Insufficient documentation

## 2019-12-02 DIAGNOSIS — E6609 Other obesity due to excess calories: Secondary | ICD-10-CM | POA: Insufficient documentation

## 2019-12-12 DIAGNOSIS — L820 Inflamed seborrheic keratosis: Secondary | ICD-10-CM | POA: Diagnosis not present

## 2019-12-16 ENCOUNTER — Other Ambulatory Visit: Payer: Self-pay | Admitting: Family Medicine

## 2019-12-16 DIAGNOSIS — L304 Erythema intertrigo: Secondary | ICD-10-CM

## 2019-12-20 DIAGNOSIS — K449 Diaphragmatic hernia without obstruction or gangrene: Secondary | ICD-10-CM | POA: Diagnosis not present

## 2019-12-20 DIAGNOSIS — K219 Gastro-esophageal reflux disease without esophagitis: Secondary | ICD-10-CM | POA: Diagnosis not present

## 2019-12-20 DIAGNOSIS — I10 Essential (primary) hypertension: Secondary | ICD-10-CM | POA: Diagnosis not present

## 2019-12-20 DIAGNOSIS — I482 Chronic atrial fibrillation, unspecified: Secondary | ICD-10-CM | POA: Diagnosis not present

## 2019-12-26 DIAGNOSIS — B372 Candidiasis of skin and nail: Secondary | ICD-10-CM | POA: Diagnosis not present

## 2020-01-17 DIAGNOSIS — M5136 Other intervertebral disc degeneration, lumbar region: Secondary | ICD-10-CM | POA: Diagnosis not present

## 2020-01-17 DIAGNOSIS — G309 Alzheimer's disease, unspecified: Secondary | ICD-10-CM | POA: Diagnosis not present

## 2020-01-17 DIAGNOSIS — I482 Chronic atrial fibrillation, unspecified: Secondary | ICD-10-CM | POA: Diagnosis not present

## 2020-01-17 DIAGNOSIS — K219 Gastro-esophageal reflux disease without esophagitis: Secondary | ICD-10-CM | POA: Diagnosis not present

## 2020-01-17 DIAGNOSIS — B372 Candidiasis of skin and nail: Secondary | ICD-10-CM | POA: Diagnosis not present

## 2020-01-18 DIAGNOSIS — M170 Bilateral primary osteoarthritis of knee: Secondary | ICD-10-CM | POA: Diagnosis not present

## 2020-01-18 DIAGNOSIS — G309 Alzheimer's disease, unspecified: Secondary | ICD-10-CM | POA: Diagnosis not present

## 2020-01-18 DIAGNOSIS — I10 Essential (primary) hypertension: Secondary | ICD-10-CM | POA: Diagnosis not present

## 2020-02-11 DIAGNOSIS — U071 COVID-19: Secondary | ICD-10-CM | POA: Diagnosis not present

## 2020-02-12 DIAGNOSIS — U071 COVID-19: Secondary | ICD-10-CM | POA: Diagnosis not present

## 2020-02-13 DIAGNOSIS — G309 Alzheimer's disease, unspecified: Secondary | ICD-10-CM | POA: Diagnosis not present

## 2020-02-13 DIAGNOSIS — M170 Bilateral primary osteoarthritis of knee: Secondary | ICD-10-CM | POA: Diagnosis not present

## 2020-02-13 DIAGNOSIS — M5136 Other intervertebral disc degeneration, lumbar region: Secondary | ICD-10-CM | POA: Diagnosis not present

## 2020-02-13 DIAGNOSIS — I482 Chronic atrial fibrillation, unspecified: Secondary | ICD-10-CM | POA: Diagnosis not present

## 2020-02-13 DIAGNOSIS — K219 Gastro-esophageal reflux disease without esophagitis: Secondary | ICD-10-CM | POA: Diagnosis not present

## 2020-02-14 DIAGNOSIS — G309 Alzheimer's disease, unspecified: Secondary | ICD-10-CM | POA: Diagnosis not present

## 2020-02-14 DIAGNOSIS — M170 Bilateral primary osteoarthritis of knee: Secondary | ICD-10-CM | POA: Diagnosis not present

## 2020-02-14 DIAGNOSIS — I10 Essential (primary) hypertension: Secondary | ICD-10-CM | POA: Diagnosis not present

## 2020-02-18 DIAGNOSIS — U071 COVID-19: Secondary | ICD-10-CM | POA: Diagnosis not present

## 2020-02-21 DIAGNOSIS — U071 COVID-19: Secondary | ICD-10-CM | POA: Diagnosis not present

## 2020-02-21 DIAGNOSIS — G309 Alzheimer's disease, unspecified: Secondary | ICD-10-CM | POA: Diagnosis not present

## 2020-02-21 DIAGNOSIS — I10 Essential (primary) hypertension: Secondary | ICD-10-CM | POA: Diagnosis not present

## 2020-02-27 DIAGNOSIS — S0181XA Laceration without foreign body of other part of head, initial encounter: Secondary | ICD-10-CM | POA: Diagnosis not present

## 2020-02-27 DIAGNOSIS — M47812 Spondylosis without myelopathy or radiculopathy, cervical region: Secondary | ICD-10-CM | POA: Diagnosis not present

## 2020-02-27 DIAGNOSIS — J3489 Other specified disorders of nose and nasal sinuses: Secondary | ICD-10-CM | POA: Diagnosis not present

## 2020-02-27 DIAGNOSIS — R6889 Other general symptoms and signs: Secondary | ICD-10-CM | POA: Diagnosis not present

## 2020-02-27 DIAGNOSIS — U071 COVID-19: Secondary | ICD-10-CM | POA: Diagnosis not present

## 2020-02-27 DIAGNOSIS — R52 Pain, unspecified: Secondary | ICD-10-CM | POA: Diagnosis not present

## 2020-02-27 DIAGNOSIS — Y999 Unspecified external cause status: Secondary | ICD-10-CM | POA: Diagnosis not present

## 2020-02-27 DIAGNOSIS — S0101XA Laceration without foreign body of scalp, initial encounter: Secondary | ICD-10-CM | POA: Diagnosis not present

## 2020-02-27 DIAGNOSIS — S199XXA Unspecified injury of neck, initial encounter: Secondary | ICD-10-CM | POA: Diagnosis not present

## 2020-02-27 DIAGNOSIS — G4489 Other headache syndrome: Secondary | ICD-10-CM | POA: Diagnosis not present

## 2020-02-27 DIAGNOSIS — M79641 Pain in right hand: Secondary | ICD-10-CM | POA: Diagnosis not present

## 2020-02-27 DIAGNOSIS — W19XXXA Unspecified fall, initial encounter: Secondary | ICD-10-CM | POA: Diagnosis not present

## 2020-02-27 DIAGNOSIS — W010XXA Fall on same level from slipping, tripping and stumbling without subsequent striking against object, initial encounter: Secondary | ICD-10-CM | POA: Diagnosis not present

## 2020-02-27 DIAGNOSIS — R410 Disorientation, unspecified: Secondary | ICD-10-CM | POA: Diagnosis not present

## 2020-02-27 DIAGNOSIS — Z743 Need for continuous supervision: Secondary | ICD-10-CM | POA: Diagnosis not present

## 2020-02-27 DIAGNOSIS — R279 Unspecified lack of coordination: Secondary | ICD-10-CM | POA: Diagnosis not present

## 2020-02-27 DIAGNOSIS — M4312 Spondylolisthesis, cervical region: Secondary | ICD-10-CM | POA: Diagnosis not present

## 2020-02-27 DIAGNOSIS — R519 Headache, unspecified: Secondary | ICD-10-CM | POA: Diagnosis not present

## 2020-02-28 DIAGNOSIS — I10 Essential (primary) hypertension: Secondary | ICD-10-CM | POA: Diagnosis not present

## 2020-02-28 DIAGNOSIS — I4811 Longstanding persistent atrial fibrillation: Secondary | ICD-10-CM | POA: Diagnosis not present

## 2020-02-28 DIAGNOSIS — M5136 Other intervertebral disc degeneration, lumbar region: Secondary | ICD-10-CM | POA: Diagnosis not present

## 2020-02-28 DIAGNOSIS — S0181XD Laceration without foreign body of other part of head, subsequent encounter: Secondary | ICD-10-CM | POA: Diagnosis not present

## 2020-03-10 DIAGNOSIS — U071 COVID-19: Secondary | ICD-10-CM | POA: Diagnosis not present

## 2020-03-13 DIAGNOSIS — M170 Bilateral primary osteoarthritis of knee: Secondary | ICD-10-CM | POA: Diagnosis not present

## 2020-03-13 DIAGNOSIS — I4811 Longstanding persistent atrial fibrillation: Secondary | ICD-10-CM | POA: Diagnosis not present

## 2020-03-13 DIAGNOSIS — S0181XD Laceration without foreign body of other part of head, subsequent encounter: Secondary | ICD-10-CM | POA: Diagnosis not present

## 2020-03-13 DIAGNOSIS — M5136 Other intervertebral disc degeneration, lumbar region: Secondary | ICD-10-CM | POA: Diagnosis not present

## 2020-03-20 DIAGNOSIS — G309 Alzheimer's disease, unspecified: Secondary | ICD-10-CM | POA: Diagnosis not present

## 2020-03-20 DIAGNOSIS — I10 Essential (primary) hypertension: Secondary | ICD-10-CM | POA: Diagnosis not present

## 2020-03-20 DIAGNOSIS — M170 Bilateral primary osteoarthritis of knee: Secondary | ICD-10-CM | POA: Diagnosis not present

## 2020-04-10 DIAGNOSIS — G309 Alzheimer's disease, unspecified: Secondary | ICD-10-CM | POA: Diagnosis not present

## 2020-04-10 DIAGNOSIS — K219 Gastro-esophageal reflux disease without esophagitis: Secondary | ICD-10-CM | POA: Diagnosis not present

## 2020-04-10 DIAGNOSIS — I10 Essential (primary) hypertension: Secondary | ICD-10-CM | POA: Diagnosis not present

## 2020-04-10 DIAGNOSIS — M5136 Other intervertebral disc degeneration, lumbar region: Secondary | ICD-10-CM | POA: Diagnosis not present

## 2020-04-10 DIAGNOSIS — I4811 Longstanding persistent atrial fibrillation: Secondary | ICD-10-CM | POA: Diagnosis not present

## 2020-04-11 DIAGNOSIS — Z79899 Other long term (current) drug therapy: Secondary | ICD-10-CM | POA: Diagnosis not present

## 2020-04-11 DIAGNOSIS — E119 Type 2 diabetes mellitus without complications: Secondary | ICD-10-CM | POA: Diagnosis not present

## 2020-04-11 DIAGNOSIS — E7849 Other hyperlipidemia: Secondary | ICD-10-CM | POA: Diagnosis not present

## 2020-04-11 DIAGNOSIS — E038 Other specified hypothyroidism: Secondary | ICD-10-CM | POA: Diagnosis not present

## 2020-04-11 DIAGNOSIS — D518 Other vitamin B12 deficiency anemias: Secondary | ICD-10-CM | POA: Diagnosis not present

## 2020-04-15 DIAGNOSIS — G309 Alzheimer's disease, unspecified: Secondary | ICD-10-CM | POA: Diagnosis not present

## 2020-04-15 DIAGNOSIS — D518 Other vitamin B12 deficiency anemias: Secondary | ICD-10-CM | POA: Diagnosis not present

## 2020-04-15 DIAGNOSIS — M170 Bilateral primary osteoarthritis of knee: Secondary | ICD-10-CM | POA: Diagnosis not present

## 2020-04-15 DIAGNOSIS — I10 Essential (primary) hypertension: Secondary | ICD-10-CM | POA: Diagnosis not present

## 2020-04-15 DIAGNOSIS — E119 Type 2 diabetes mellitus without complications: Secondary | ICD-10-CM | POA: Diagnosis not present

## 2020-04-22 DIAGNOSIS — R590 Localized enlarged lymph nodes: Secondary | ICD-10-CM | POA: Diagnosis not present

## 2020-04-22 DIAGNOSIS — I6523 Occlusion and stenosis of bilateral carotid arteries: Secondary | ICD-10-CM | POA: Diagnosis not present

## 2020-04-22 DIAGNOSIS — D492 Neoplasm of unspecified behavior of bone, soft tissue, and skin: Secondary | ICD-10-CM | POA: Diagnosis not present

## 2020-04-22 DIAGNOSIS — R0989 Other specified symptoms and signs involving the circulatory and respiratory systems: Secondary | ICD-10-CM | POA: Diagnosis not present

## 2020-04-22 DIAGNOSIS — R229 Localized swelling, mass and lump, unspecified: Secondary | ICD-10-CM | POA: Diagnosis not present

## 2020-04-25 DIAGNOSIS — I700 Atherosclerosis of aorta: Secondary | ICD-10-CM | POA: Diagnosis not present

## 2020-04-25 DIAGNOSIS — R221 Localized swelling, mass and lump, neck: Secondary | ICD-10-CM | POA: Diagnosis not present

## 2020-04-25 DIAGNOSIS — I714 Abdominal aortic aneurysm, without rupture: Secondary | ICD-10-CM | POA: Diagnosis not present

## 2020-04-25 DIAGNOSIS — E042 Nontoxic multinodular goiter: Secondary | ICD-10-CM | POA: Diagnosis not present

## 2020-04-27 NOTE — Chronic Care Management (AMB) (Signed)
Spoke with patient's daughter.   Patient is now living at a facility [facility]) and receives her medications/medical care within the facility. Patient is no longer a candidate for chronic care management services.   Sherre Poot, PharmD, [facility] Clinical Pharmacist [facility] [phone] (office) [phone] (mobile)

## 2020-04-28 ENCOUNTER — Ambulatory Visit: Payer: Medicare Other

## 2020-04-28 NOTE — Patient Instructions (Signed)
Visit Information  Goals Addressed   None     Patient verbalizes understanding of instructions provided today.   CCM enrollment status changed to "previously enrolled" as per patient request on 04/28/2020 to discontinue enrollment. Case closed to case management services in primary care home.    Sherre Poot, PharmD, [facility] Clinical Pharmacist [facility] [phone] (office) [phone] (mobile)

## 2020-04-29 DIAGNOSIS — I70229 Atherosclerosis of native arteries of extremities with rest pain, unspecified extremity: Secondary | ICD-10-CM | POA: Diagnosis not present

## 2020-04-29 DIAGNOSIS — I739 Peripheral vascular disease, unspecified: Secondary | ICD-10-CM | POA: Diagnosis not present

## 2020-04-30 DIAGNOSIS — R011 Cardiac murmur, unspecified: Secondary | ICD-10-CM | POA: Diagnosis not present

## 2020-05-06 DIAGNOSIS — G309 Alzheimer's disease, unspecified: Secondary | ICD-10-CM | POA: Diagnosis not present

## 2020-05-06 DIAGNOSIS — I4811 Longstanding persistent atrial fibrillation: Secondary | ICD-10-CM | POA: Diagnosis not present

## 2020-05-06 DIAGNOSIS — I10 Essential (primary) hypertension: Secondary | ICD-10-CM | POA: Diagnosis not present

## 2020-05-06 DIAGNOSIS — R1310 Dysphagia, unspecified: Secondary | ICD-10-CM | POA: Diagnosis not present

## 2020-05-07 DIAGNOSIS — G309 Alzheimer's disease, unspecified: Secondary | ICD-10-CM | POA: Diagnosis not present

## 2020-05-07 DIAGNOSIS — M170 Bilateral primary osteoarthritis of knee: Secondary | ICD-10-CM | POA: Diagnosis not present

## 2020-05-07 DIAGNOSIS — D518 Other vitamin B12 deficiency anemias: Secondary | ICD-10-CM | POA: Diagnosis not present

## 2020-05-07 DIAGNOSIS — I10 Essential (primary) hypertension: Secondary | ICD-10-CM | POA: Diagnosis not present

## 2020-05-07 DIAGNOSIS — E119 Type 2 diabetes mellitus without complications: Secondary | ICD-10-CM | POA: Diagnosis not present

## 2020-05-13 DIAGNOSIS — K449 Diaphragmatic hernia without obstruction or gangrene: Secondary | ICD-10-CM | POA: Diagnosis not present

## 2020-05-13 DIAGNOSIS — G894 Chronic pain syndrome: Secondary | ICD-10-CM | POA: Diagnosis not present

## 2020-05-13 DIAGNOSIS — G309 Alzheimer's disease, unspecified: Secondary | ICD-10-CM | POA: Diagnosis not present

## 2020-05-13 DIAGNOSIS — M179 Osteoarthritis of knee, unspecified: Secondary | ICD-10-CM | POA: Diagnosis not present

## 2020-05-13 DIAGNOSIS — M5136 Other intervertebral disc degeneration, lumbar region: Secondary | ICD-10-CM | POA: Diagnosis not present

## 2020-05-13 DIAGNOSIS — Z9181 History of falling: Secondary | ICD-10-CM | POA: Diagnosis not present

## 2020-05-13 DIAGNOSIS — K219 Gastro-esophageal reflux disease without esophagitis: Secondary | ICD-10-CM | POA: Diagnosis not present

## 2020-05-13 DIAGNOSIS — R1310 Dysphagia, unspecified: Secondary | ICD-10-CM | POA: Diagnosis not present

## 2020-05-13 DIAGNOSIS — Z7901 Long term (current) use of anticoagulants: Secondary | ICD-10-CM | POA: Diagnosis not present

## 2020-05-13 DIAGNOSIS — Z9049 Acquired absence of other specified parts of digestive tract: Secondary | ICD-10-CM | POA: Diagnosis not present

## 2020-05-13 DIAGNOSIS — I4811 Longstanding persistent atrial fibrillation: Secondary | ICD-10-CM | POA: Diagnosis not present

## 2020-05-20 DIAGNOSIS — G894 Chronic pain syndrome: Secondary | ICD-10-CM | POA: Diagnosis not present

## 2020-05-20 DIAGNOSIS — I4811 Longstanding persistent atrial fibrillation: Secondary | ICD-10-CM | POA: Diagnosis not present

## 2020-05-20 DIAGNOSIS — G309 Alzheimer's disease, unspecified: Secondary | ICD-10-CM | POA: Diagnosis not present

## 2020-05-20 DIAGNOSIS — R1310 Dysphagia, unspecified: Secondary | ICD-10-CM | POA: Diagnosis not present

## 2020-06-03 DIAGNOSIS — K219 Gastro-esophageal reflux disease without esophagitis: Secondary | ICD-10-CM | POA: Diagnosis not present

## 2020-06-03 DIAGNOSIS — D518 Other vitamin B12 deficiency anemias: Secondary | ICD-10-CM | POA: Diagnosis not present

## 2020-06-03 DIAGNOSIS — I4811 Longstanding persistent atrial fibrillation: Secondary | ICD-10-CM | POA: Diagnosis not present

## 2020-06-03 DIAGNOSIS — E119 Type 2 diabetes mellitus without complications: Secondary | ICD-10-CM | POA: Diagnosis not present

## 2020-06-03 DIAGNOSIS — M5136 Other intervertebral disc degeneration, lumbar region: Secondary | ICD-10-CM | POA: Diagnosis not present

## 2020-06-03 DIAGNOSIS — M170 Bilateral primary osteoarthritis of knee: Secondary | ICD-10-CM | POA: Diagnosis not present

## 2020-06-03 DIAGNOSIS — G309 Alzheimer's disease, unspecified: Secondary | ICD-10-CM | POA: Diagnosis not present

## 2020-06-03 DIAGNOSIS — I10 Essential (primary) hypertension: Secondary | ICD-10-CM | POA: Diagnosis not present

## 2020-06-18 DIAGNOSIS — U071 COVID-19: Secondary | ICD-10-CM | POA: Diagnosis not present

## 2020-06-26 DIAGNOSIS — E119 Type 2 diabetes mellitus without complications: Secondary | ICD-10-CM | POA: Diagnosis not present

## 2020-06-26 DIAGNOSIS — E038 Other specified hypothyroidism: Secondary | ICD-10-CM | POA: Diagnosis not present

## 2020-06-26 DIAGNOSIS — M170 Bilateral primary osteoarthritis of knee: Secondary | ICD-10-CM | POA: Diagnosis not present

## 2020-06-26 DIAGNOSIS — I10 Essential (primary) hypertension: Secondary | ICD-10-CM | POA: Diagnosis not present

## 2020-06-26 DIAGNOSIS — D518 Other vitamin B12 deficiency anemias: Secondary | ICD-10-CM | POA: Diagnosis not present

## 2020-06-26 DIAGNOSIS — G309 Alzheimer's disease, unspecified: Secondary | ICD-10-CM | POA: Diagnosis not present

## 2020-06-26 DIAGNOSIS — E559 Vitamin D deficiency, unspecified: Secondary | ICD-10-CM | POA: Diagnosis not present

## 2020-07-01 DIAGNOSIS — M5136 Other intervertebral disc degeneration, lumbar region: Secondary | ICD-10-CM | POA: Diagnosis not present

## 2020-07-01 DIAGNOSIS — I10 Essential (primary) hypertension: Secondary | ICD-10-CM | POA: Diagnosis not present

## 2020-07-01 DIAGNOSIS — M170 Bilateral primary osteoarthritis of knee: Secondary | ICD-10-CM | POA: Diagnosis not present

## 2020-07-01 DIAGNOSIS — I4811 Longstanding persistent atrial fibrillation: Secondary | ICD-10-CM | POA: Diagnosis not present

## 2020-07-01 DIAGNOSIS — K219 Gastro-esophageal reflux disease without esophagitis: Secondary | ICD-10-CM | POA: Diagnosis not present

## 2020-07-01 DIAGNOSIS — G309 Alzheimer's disease, unspecified: Secondary | ICD-10-CM | POA: Diagnosis not present

## 2020-07-17 DIAGNOSIS — B351 Tinea unguium: Secondary | ICD-10-CM | POA: Diagnosis not present

## 2020-07-17 DIAGNOSIS — L84 Corns and callosities: Secondary | ICD-10-CM | POA: Diagnosis not present

## 2020-07-18 DIAGNOSIS — E038 Other specified hypothyroidism: Secondary | ICD-10-CM | POA: Diagnosis not present

## 2020-07-18 DIAGNOSIS — Z79899 Other long term (current) drug therapy: Secondary | ICD-10-CM | POA: Diagnosis not present

## 2020-07-18 DIAGNOSIS — E7849 Other hyperlipidemia: Secondary | ICD-10-CM | POA: Diagnosis not present

## 2020-07-18 DIAGNOSIS — E559 Vitamin D deficiency, unspecified: Secondary | ICD-10-CM | POA: Diagnosis not present

## 2020-07-18 DIAGNOSIS — E119 Type 2 diabetes mellitus without complications: Secondary | ICD-10-CM | POA: Diagnosis not present

## 2020-07-18 DIAGNOSIS — D518 Other vitamin B12 deficiency anemias: Secondary | ICD-10-CM | POA: Diagnosis not present

## 2020-07-26 DIAGNOSIS — E119 Type 2 diabetes mellitus without complications: Secondary | ICD-10-CM | POA: Diagnosis not present

## 2020-07-26 DIAGNOSIS — D518 Other vitamin B12 deficiency anemias: Secondary | ICD-10-CM | POA: Diagnosis not present

## 2020-07-26 DIAGNOSIS — G309 Alzheimer's disease, unspecified: Secondary | ICD-10-CM | POA: Diagnosis not present

## 2020-07-26 DIAGNOSIS — E038 Other specified hypothyroidism: Secondary | ICD-10-CM | POA: Diagnosis not present

## 2020-07-26 DIAGNOSIS — E559 Vitamin D deficiency, unspecified: Secondary | ICD-10-CM | POA: Diagnosis not present

## 2020-07-26 DIAGNOSIS — M170 Bilateral primary osteoarthritis of knee: Secondary | ICD-10-CM | POA: Diagnosis not present

## 2020-07-26 DIAGNOSIS — I10 Essential (primary) hypertension: Secondary | ICD-10-CM | POA: Diagnosis not present

## 2020-07-29 DIAGNOSIS — M5136 Other intervertebral disc degeneration, lumbar region: Secondary | ICD-10-CM | POA: Diagnosis not present

## 2020-07-29 DIAGNOSIS — I10 Essential (primary) hypertension: Secondary | ICD-10-CM | POA: Diagnosis not present

## 2020-07-29 DIAGNOSIS — I4811 Longstanding persistent atrial fibrillation: Secondary | ICD-10-CM | POA: Diagnosis not present

## 2020-07-29 DIAGNOSIS — K219 Gastro-esophageal reflux disease without esophagitis: Secondary | ICD-10-CM | POA: Diagnosis not present

## 2020-07-29 DIAGNOSIS — N390 Urinary tract infection, site not specified: Secondary | ICD-10-CM | POA: Diagnosis not present

## 2020-07-29 DIAGNOSIS — G309 Alzheimer's disease, unspecified: Secondary | ICD-10-CM | POA: Diagnosis not present

## 2020-08-26 DIAGNOSIS — M5136 Other intervertebral disc degeneration, lumbar region: Secondary | ICD-10-CM | POA: Diagnosis not present

## 2020-08-26 DIAGNOSIS — G309 Alzheimer's disease, unspecified: Secondary | ICD-10-CM | POA: Diagnosis not present

## 2020-08-26 DIAGNOSIS — I4811 Longstanding persistent atrial fibrillation: Secondary | ICD-10-CM | POA: Diagnosis not present

## 2020-08-26 DIAGNOSIS — I10 Essential (primary) hypertension: Secondary | ICD-10-CM | POA: Diagnosis not present

## 2020-09-05 DIAGNOSIS — E038 Other specified hypothyroidism: Secondary | ICD-10-CM | POA: Diagnosis not present

## 2020-09-05 DIAGNOSIS — E119 Type 2 diabetes mellitus without complications: Secondary | ICD-10-CM | POA: Diagnosis not present

## 2020-09-05 DIAGNOSIS — G309 Alzheimer's disease, unspecified: Secondary | ICD-10-CM | POA: Diagnosis not present

## 2020-09-05 DIAGNOSIS — E559 Vitamin D deficiency, unspecified: Secondary | ICD-10-CM | POA: Diagnosis not present

## 2020-09-05 DIAGNOSIS — I10 Essential (primary) hypertension: Secondary | ICD-10-CM | POA: Diagnosis not present

## 2020-09-05 DIAGNOSIS — M170 Bilateral primary osteoarthritis of knee: Secondary | ICD-10-CM | POA: Diagnosis not present

## 2020-09-05 DIAGNOSIS — D518 Other vitamin B12 deficiency anemias: Secondary | ICD-10-CM | POA: Diagnosis not present

## 2020-09-30 DIAGNOSIS — I4891 Unspecified atrial fibrillation: Secondary | ICD-10-CM | POA: Diagnosis not present

## 2020-09-30 DIAGNOSIS — I10 Essential (primary) hypertension: Secondary | ICD-10-CM | POA: Diagnosis not present

## 2020-09-30 DIAGNOSIS — G309 Alzheimer's disease, unspecified: Secondary | ICD-10-CM | POA: Diagnosis not present

## 2020-09-30 DIAGNOSIS — M5136 Other intervertebral disc degeneration, lumbar region: Secondary | ICD-10-CM | POA: Diagnosis not present

## 2020-09-30 DIAGNOSIS — E039 Hypothyroidism, unspecified: Secondary | ICD-10-CM | POA: Diagnosis not present

## 2020-10-08 ENCOUNTER — Telehealth: Payer: Self-pay

## 2020-10-08 NOTE — Telephone Encounter (Signed)
[facility] called stating pt fell this morning. Pt fell transferring herself to commode. She was complaining of L leg pain. They did call EMS out of caution. EMS cleared pt and stated vitals were fine. Pt did decline going to hospital for imaging. Home wanted to make dr aware. Also wanted to make sure clinic did not have orders for pt.   Harrell Lark 10/08/20 8:51 AM

## 2020-10-11 DIAGNOSIS — E559 Vitamin D deficiency, unspecified: Secondary | ICD-10-CM | POA: Diagnosis not present

## 2020-10-11 DIAGNOSIS — E038 Other specified hypothyroidism: Secondary | ICD-10-CM | POA: Diagnosis not present

## 2020-10-11 DIAGNOSIS — E119 Type 2 diabetes mellitus without complications: Secondary | ICD-10-CM | POA: Diagnosis not present

## 2020-10-11 DIAGNOSIS — I4891 Unspecified atrial fibrillation: Secondary | ICD-10-CM | POA: Diagnosis not present

## 2020-10-11 DIAGNOSIS — D518 Other vitamin B12 deficiency anemias: Secondary | ICD-10-CM | POA: Diagnosis not present

## 2020-10-11 DIAGNOSIS — G309 Alzheimer's disease, unspecified: Secondary | ICD-10-CM | POA: Diagnosis not present

## 2020-10-11 DIAGNOSIS — I10 Essential (primary) hypertension: Secondary | ICD-10-CM | POA: Diagnosis not present

## 2020-10-11 DIAGNOSIS — M170 Bilateral primary osteoarthritis of knee: Secondary | ICD-10-CM | POA: Diagnosis not present

## 2020-10-11 DIAGNOSIS — E039 Hypothyroidism, unspecified: Secondary | ICD-10-CM | POA: Diagnosis not present

## 2020-10-16 DIAGNOSIS — Z79891 Long term (current) use of opiate analgesic: Secondary | ICD-10-CM | POA: Diagnosis not present

## 2020-10-16 DIAGNOSIS — E539 Vitamin B deficiency, unspecified: Secondary | ICD-10-CM | POA: Diagnosis not present

## 2020-10-16 DIAGNOSIS — K449 Diaphragmatic hernia without obstruction or gangrene: Secondary | ICD-10-CM | POA: Diagnosis not present

## 2020-10-16 DIAGNOSIS — Z7901 Long term (current) use of anticoagulants: Secondary | ICD-10-CM | POA: Diagnosis not present

## 2020-10-16 DIAGNOSIS — I482 Chronic atrial fibrillation, unspecified: Secondary | ICD-10-CM | POA: Diagnosis not present

## 2020-10-16 DIAGNOSIS — G309 Alzheimer's disease, unspecified: Secondary | ICD-10-CM | POA: Diagnosis not present

## 2020-10-16 DIAGNOSIS — K219 Gastro-esophageal reflux disease without esophagitis: Secondary | ICD-10-CM | POA: Diagnosis not present

## 2020-10-16 DIAGNOSIS — M170 Bilateral primary osteoarthritis of knee: Secondary | ICD-10-CM | POA: Diagnosis not present

## 2020-10-16 DIAGNOSIS — M5116 Intervertebral disc disorders with radiculopathy, lumbar region: Secondary | ICD-10-CM | POA: Diagnosis not present

## 2020-10-27 DIAGNOSIS — E559 Vitamin D deficiency, unspecified: Secondary | ICD-10-CM | POA: Diagnosis not present

## 2020-10-27 DIAGNOSIS — M170 Bilateral primary osteoarthritis of knee: Secondary | ICD-10-CM | POA: Diagnosis not present

## 2020-10-27 DIAGNOSIS — I4891 Unspecified atrial fibrillation: Secondary | ICD-10-CM | POA: Diagnosis not present

## 2020-10-27 DIAGNOSIS — E038 Other specified hypothyroidism: Secondary | ICD-10-CM | POA: Diagnosis not present

## 2020-10-27 DIAGNOSIS — G309 Alzheimer's disease, unspecified: Secondary | ICD-10-CM | POA: Diagnosis not present

## 2020-10-27 DIAGNOSIS — D518 Other vitamin B12 deficiency anemias: Secondary | ICD-10-CM | POA: Diagnosis not present

## 2020-10-27 DIAGNOSIS — E119 Type 2 diabetes mellitus without complications: Secondary | ICD-10-CM | POA: Diagnosis not present

## 2020-10-27 DIAGNOSIS — E039 Hypothyroidism, unspecified: Secondary | ICD-10-CM | POA: Diagnosis not present

## 2020-10-27 DIAGNOSIS — I10 Essential (primary) hypertension: Secondary | ICD-10-CM | POA: Diagnosis not present

## 2020-10-28 DIAGNOSIS — I4891 Unspecified atrial fibrillation: Secondary | ICD-10-CM | POA: Diagnosis not present

## 2020-10-28 DIAGNOSIS — I10 Essential (primary) hypertension: Secondary | ICD-10-CM | POA: Diagnosis not present

## 2020-10-28 DIAGNOSIS — G309 Alzheimer's disease, unspecified: Secondary | ICD-10-CM | POA: Diagnosis not present

## 2020-10-28 DIAGNOSIS — M5136 Other intervertebral disc degeneration, lumbar region: Secondary | ICD-10-CM | POA: Diagnosis not present

## 2020-10-28 DIAGNOSIS — R2681 Unsteadiness on feet: Secondary | ICD-10-CM | POA: Diagnosis not present

## 2020-10-29 DIAGNOSIS — K219 Gastro-esophageal reflux disease without esophagitis: Secondary | ICD-10-CM | POA: Diagnosis not present

## 2020-10-29 DIAGNOSIS — K449 Diaphragmatic hernia without obstruction or gangrene: Secondary | ICD-10-CM | POA: Diagnosis not present

## 2020-10-29 DIAGNOSIS — M170 Bilateral primary osteoarthritis of knee: Secondary | ICD-10-CM | POA: Diagnosis not present

## 2020-10-29 DIAGNOSIS — E539 Vitamin B deficiency, unspecified: Secondary | ICD-10-CM | POA: Diagnosis not present

## 2020-10-29 DIAGNOSIS — Z79891 Long term (current) use of opiate analgesic: Secondary | ICD-10-CM | POA: Diagnosis not present

## 2020-10-29 DIAGNOSIS — Z7901 Long term (current) use of anticoagulants: Secondary | ICD-10-CM | POA: Diagnosis not present

## 2020-10-29 DIAGNOSIS — G309 Alzheimer's disease, unspecified: Secondary | ICD-10-CM | POA: Diagnosis not present

## 2020-10-29 DIAGNOSIS — I482 Chronic atrial fibrillation, unspecified: Secondary | ICD-10-CM | POA: Diagnosis not present

## 2020-10-29 DIAGNOSIS — M5116 Intervertebral disc disorders with radiculopathy, lumbar region: Secondary | ICD-10-CM | POA: Diagnosis not present

## 2020-10-30 DIAGNOSIS — E539 Vitamin B deficiency, unspecified: Secondary | ICD-10-CM | POA: Diagnosis not present

## 2020-10-30 DIAGNOSIS — M170 Bilateral primary osteoarthritis of knee: Secondary | ICD-10-CM | POA: Diagnosis not present

## 2020-10-30 DIAGNOSIS — I482 Chronic atrial fibrillation, unspecified: Secondary | ICD-10-CM | POA: Diagnosis not present

## 2020-10-30 DIAGNOSIS — M5116 Intervertebral disc disorders with radiculopathy, lumbar region: Secondary | ICD-10-CM | POA: Diagnosis not present

## 2020-10-30 DIAGNOSIS — G309 Alzheimer's disease, unspecified: Secondary | ICD-10-CM | POA: Diagnosis not present

## 2020-10-31 DIAGNOSIS — K449 Diaphragmatic hernia without obstruction or gangrene: Secondary | ICD-10-CM | POA: Diagnosis not present

## 2020-10-31 DIAGNOSIS — Z7901 Long term (current) use of anticoagulants: Secondary | ICD-10-CM | POA: Diagnosis not present

## 2020-10-31 DIAGNOSIS — G309 Alzheimer's disease, unspecified: Secondary | ICD-10-CM | POA: Diagnosis not present

## 2020-10-31 DIAGNOSIS — K219 Gastro-esophageal reflux disease without esophagitis: Secondary | ICD-10-CM | POA: Diagnosis not present

## 2020-10-31 DIAGNOSIS — I482 Chronic atrial fibrillation, unspecified: Secondary | ICD-10-CM | POA: Diagnosis not present

## 2020-10-31 DIAGNOSIS — Z79891 Long term (current) use of opiate analgesic: Secondary | ICD-10-CM | POA: Diagnosis not present

## 2020-10-31 DIAGNOSIS — M5116 Intervertebral disc disorders with radiculopathy, lumbar region: Secondary | ICD-10-CM | POA: Diagnosis not present

## 2020-10-31 DIAGNOSIS — M170 Bilateral primary osteoarthritis of knee: Secondary | ICD-10-CM | POA: Diagnosis not present

## 2020-10-31 DIAGNOSIS — E539 Vitamin B deficiency, unspecified: Secondary | ICD-10-CM | POA: Diagnosis not present

## 2020-11-10 DIAGNOSIS — H9201 Otalgia, right ear: Secondary | ICD-10-CM | POA: Diagnosis not present

## 2020-11-12 DIAGNOSIS — K219 Gastro-esophageal reflux disease without esophagitis: Secondary | ICD-10-CM | POA: Diagnosis not present

## 2020-11-12 DIAGNOSIS — I482 Chronic atrial fibrillation, unspecified: Secondary | ICD-10-CM | POA: Diagnosis not present

## 2020-11-12 DIAGNOSIS — G309 Alzheimer's disease, unspecified: Secondary | ICD-10-CM | POA: Diagnosis not present

## 2020-11-12 DIAGNOSIS — E539 Vitamin B deficiency, unspecified: Secondary | ICD-10-CM | POA: Diagnosis not present

## 2020-11-12 DIAGNOSIS — Z7901 Long term (current) use of anticoagulants: Secondary | ICD-10-CM | POA: Diagnosis not present

## 2020-11-12 DIAGNOSIS — M170 Bilateral primary osteoarthritis of knee: Secondary | ICD-10-CM | POA: Diagnosis not present

## 2020-11-12 DIAGNOSIS — Z79891 Long term (current) use of opiate analgesic: Secondary | ICD-10-CM | POA: Diagnosis not present

## 2020-11-12 DIAGNOSIS — K449 Diaphragmatic hernia without obstruction or gangrene: Secondary | ICD-10-CM | POA: Diagnosis not present

## 2020-11-12 DIAGNOSIS — M5116 Intervertebral disc disorders with radiculopathy, lumbar region: Secondary | ICD-10-CM | POA: Diagnosis not present

## 2020-11-18 DIAGNOSIS — B029 Zoster without complications: Secondary | ICD-10-CM | POA: Diagnosis not present

## 2020-11-18 DIAGNOSIS — H9202 Otalgia, left ear: Secondary | ICD-10-CM | POA: Diagnosis not present

## 2020-11-20 DIAGNOSIS — Z743 Need for continuous supervision: Secondary | ICD-10-CM | POA: Diagnosis not present

## 2020-11-20 DIAGNOSIS — Z7901 Long term (current) use of anticoagulants: Secondary | ICD-10-CM | POA: Diagnosis not present

## 2020-11-20 DIAGNOSIS — R9431 Abnormal electrocardiogram [ECG] [EKG]: Secondary | ICD-10-CM | POA: Diagnosis not present

## 2020-11-20 DIAGNOSIS — I480 Paroxysmal atrial fibrillation: Secondary | ICD-10-CM | POA: Diagnosis not present

## 2020-11-20 DIAGNOSIS — Z66 Do not resuscitate: Secondary | ICD-10-CM | POA: Diagnosis not present

## 2020-11-20 DIAGNOSIS — E860 Dehydration: Secondary | ICD-10-CM | POA: Diagnosis not present

## 2020-11-20 DIAGNOSIS — L03211 Cellulitis of face: Secondary | ICD-10-CM | POA: Diagnosis not present

## 2020-11-20 DIAGNOSIS — B0230 Zoster ocular disease, unspecified: Secondary | ICD-10-CM | POA: Diagnosis not present

## 2020-11-20 DIAGNOSIS — L03213 Periorbital cellulitis: Secondary | ICD-10-CM | POA: Diagnosis not present

## 2020-11-20 DIAGNOSIS — B0229 Other postherpetic nervous system involvement: Secondary | ICD-10-CM | POA: Diagnosis not present

## 2020-11-20 DIAGNOSIS — G309 Alzheimer's disease, unspecified: Secondary | ICD-10-CM | POA: Diagnosis not present

## 2020-11-20 DIAGNOSIS — E039 Hypothyroidism, unspecified: Secondary | ICD-10-CM | POA: Diagnosis not present

## 2020-11-20 DIAGNOSIS — I10 Essential (primary) hypertension: Secondary | ICD-10-CM | POA: Insufficient documentation

## 2020-11-20 DIAGNOSIS — Z79899 Other long term (current) drug therapy: Secondary | ICD-10-CM | POA: Diagnosis not present

## 2020-11-20 DIAGNOSIS — R0902 Hypoxemia: Secondary | ICD-10-CM | POA: Diagnosis not present

## 2020-11-20 DIAGNOSIS — B029 Zoster without complications: Secondary | ICD-10-CM | POA: Diagnosis not present

## 2020-11-20 DIAGNOSIS — R404 Transient alteration of awareness: Secondary | ICD-10-CM | POA: Diagnosis not present

## 2020-11-20 DIAGNOSIS — Z20822 Contact with and (suspected) exposure to covid-19: Secondary | ICD-10-CM | POA: Diagnosis not present

## 2020-11-21 DIAGNOSIS — G309 Alzheimer's disease, unspecified: Secondary | ICD-10-CM | POA: Diagnosis not present

## 2020-11-21 DIAGNOSIS — L03213 Periorbital cellulitis: Secondary | ICD-10-CM | POA: Diagnosis not present

## 2020-11-21 DIAGNOSIS — Z7901 Long term (current) use of anticoagulants: Secondary | ICD-10-CM | POA: Diagnosis not present

## 2020-11-21 DIAGNOSIS — E039 Hypothyroidism, unspecified: Secondary | ICD-10-CM | POA: Diagnosis not present

## 2020-11-21 DIAGNOSIS — B0230 Zoster ocular disease, unspecified: Secondary | ICD-10-CM | POA: Diagnosis not present

## 2020-11-21 DIAGNOSIS — I10 Essential (primary) hypertension: Secondary | ICD-10-CM | POA: Diagnosis not present

## 2020-11-21 DIAGNOSIS — I480 Paroxysmal atrial fibrillation: Secondary | ICD-10-CM | POA: Diagnosis not present

## 2020-12-01 DIAGNOSIS — Z7901 Long term (current) use of anticoagulants: Secondary | ICD-10-CM | POA: Diagnosis not present

## 2020-12-01 DIAGNOSIS — I482 Chronic atrial fibrillation, unspecified: Secondary | ICD-10-CM | POA: Diagnosis not present

## 2020-12-01 DIAGNOSIS — Z79891 Long term (current) use of opiate analgesic: Secondary | ICD-10-CM | POA: Diagnosis not present

## 2020-12-01 DIAGNOSIS — K219 Gastro-esophageal reflux disease without esophagitis: Secondary | ICD-10-CM | POA: Diagnosis not present

## 2020-12-01 DIAGNOSIS — E539 Vitamin B deficiency, unspecified: Secondary | ICD-10-CM | POA: Diagnosis not present

## 2020-12-01 DIAGNOSIS — M170 Bilateral primary osteoarthritis of knee: Secondary | ICD-10-CM | POA: Diagnosis not present

## 2020-12-01 DIAGNOSIS — K449 Diaphragmatic hernia without obstruction or gangrene: Secondary | ICD-10-CM | POA: Diagnosis not present

## 2020-12-01 DIAGNOSIS — M5116 Intervertebral disc disorders with radiculopathy, lumbar region: Secondary | ICD-10-CM | POA: Diagnosis not present

## 2020-12-01 DIAGNOSIS — G309 Alzheimer's disease, unspecified: Secondary | ICD-10-CM | POA: Diagnosis not present

## 2020-12-10 DIAGNOSIS — Z7901 Long term (current) use of anticoagulants: Secondary | ICD-10-CM | POA: Diagnosis not present

## 2020-12-10 DIAGNOSIS — K449 Diaphragmatic hernia without obstruction or gangrene: Secondary | ICD-10-CM | POA: Diagnosis not present

## 2020-12-10 DIAGNOSIS — K219 Gastro-esophageal reflux disease without esophagitis: Secondary | ICD-10-CM | POA: Diagnosis not present

## 2020-12-10 DIAGNOSIS — M5116 Intervertebral disc disorders with radiculopathy, lumbar region: Secondary | ICD-10-CM | POA: Diagnosis not present

## 2020-12-10 DIAGNOSIS — E539 Vitamin B deficiency, unspecified: Secondary | ICD-10-CM | POA: Diagnosis not present

## 2020-12-10 DIAGNOSIS — I482 Chronic atrial fibrillation, unspecified: Secondary | ICD-10-CM | POA: Diagnosis not present

## 2020-12-10 DIAGNOSIS — G309 Alzheimer's disease, unspecified: Secondary | ICD-10-CM | POA: Diagnosis not present

## 2020-12-10 DIAGNOSIS — M170 Bilateral primary osteoarthritis of knee: Secondary | ICD-10-CM | POA: Diagnosis not present

## 2020-12-10 DIAGNOSIS — Z79891 Long term (current) use of opiate analgesic: Secondary | ICD-10-CM | POA: Diagnosis not present

## 2020-12-16 DIAGNOSIS — N390 Urinary tract infection, site not specified: Secondary | ICD-10-CM | POA: Diagnosis not present

## 2020-12-17 DIAGNOSIS — I10 Essential (primary) hypertension: Secondary | ICD-10-CM | POA: Diagnosis not present

## 2020-12-18 DIAGNOSIS — Z7901 Long term (current) use of anticoagulants: Secondary | ICD-10-CM | POA: Diagnosis not present

## 2020-12-18 DIAGNOSIS — M170 Bilateral primary osteoarthritis of knee: Secondary | ICD-10-CM | POA: Diagnosis not present

## 2020-12-18 DIAGNOSIS — K219 Gastro-esophageal reflux disease without esophagitis: Secondary | ICD-10-CM | POA: Diagnosis not present

## 2020-12-18 DIAGNOSIS — G309 Alzheimer's disease, unspecified: Secondary | ICD-10-CM | POA: Diagnosis not present

## 2020-12-18 DIAGNOSIS — Z79891 Long term (current) use of opiate analgesic: Secondary | ICD-10-CM | POA: Diagnosis not present

## 2020-12-18 DIAGNOSIS — K449 Diaphragmatic hernia without obstruction or gangrene: Secondary | ICD-10-CM | POA: Diagnosis not present

## 2020-12-18 DIAGNOSIS — E539 Vitamin B deficiency, unspecified: Secondary | ICD-10-CM | POA: Diagnosis not present

## 2020-12-18 DIAGNOSIS — M5116 Intervertebral disc disorders with radiculopathy, lumbar region: Secondary | ICD-10-CM | POA: Diagnosis not present

## 2020-12-18 DIAGNOSIS — I482 Chronic atrial fibrillation, unspecified: Secondary | ICD-10-CM | POA: Diagnosis not present

## 2020-12-24 DIAGNOSIS — G309 Alzheimer's disease, unspecified: Secondary | ICD-10-CM | POA: Diagnosis not present

## 2020-12-24 DIAGNOSIS — D518 Other vitamin B12 deficiency anemias: Secondary | ICD-10-CM | POA: Diagnosis not present

## 2020-12-24 DIAGNOSIS — I10 Essential (primary) hypertension: Secondary | ICD-10-CM | POA: Diagnosis not present

## 2020-12-24 DIAGNOSIS — I4891 Unspecified atrial fibrillation: Secondary | ICD-10-CM | POA: Diagnosis not present

## 2020-12-24 DIAGNOSIS — M170 Bilateral primary osteoarthritis of knee: Secondary | ICD-10-CM | POA: Diagnosis not present

## 2020-12-24 DIAGNOSIS — E038 Other specified hypothyroidism: Secondary | ICD-10-CM | POA: Diagnosis not present

## 2020-12-24 DIAGNOSIS — E559 Vitamin D deficiency, unspecified: Secondary | ICD-10-CM | POA: Diagnosis not present

## 2020-12-24 DIAGNOSIS — E039 Hypothyroidism, unspecified: Secondary | ICD-10-CM | POA: Diagnosis not present

## 2020-12-24 DIAGNOSIS — E119 Type 2 diabetes mellitus without complications: Secondary | ICD-10-CM | POA: Diagnosis not present

## 2020-12-26 DIAGNOSIS — Z7901 Long term (current) use of anticoagulants: Secondary | ICD-10-CM | POA: Diagnosis not present

## 2020-12-26 DIAGNOSIS — M170 Bilateral primary osteoarthritis of knee: Secondary | ICD-10-CM | POA: Diagnosis not present

## 2020-12-26 DIAGNOSIS — Z79891 Long term (current) use of opiate analgesic: Secondary | ICD-10-CM | POA: Diagnosis not present

## 2020-12-26 DIAGNOSIS — G309 Alzheimer's disease, unspecified: Secondary | ICD-10-CM | POA: Diagnosis not present

## 2020-12-26 DIAGNOSIS — M5116 Intervertebral disc disorders with radiculopathy, lumbar region: Secondary | ICD-10-CM | POA: Diagnosis not present

## 2020-12-26 DIAGNOSIS — I482 Chronic atrial fibrillation, unspecified: Secondary | ICD-10-CM | POA: Diagnosis not present

## 2020-12-26 DIAGNOSIS — E539 Vitamin B deficiency, unspecified: Secondary | ICD-10-CM | POA: Diagnosis not present

## 2020-12-26 DIAGNOSIS — K219 Gastro-esophageal reflux disease without esophagitis: Secondary | ICD-10-CM | POA: Diagnosis not present

## 2020-12-26 DIAGNOSIS — K449 Diaphragmatic hernia without obstruction or gangrene: Secondary | ICD-10-CM | POA: Diagnosis not present

## 2020-12-31 DIAGNOSIS — I482 Chronic atrial fibrillation, unspecified: Secondary | ICD-10-CM | POA: Diagnosis not present

## 2020-12-31 DIAGNOSIS — E539 Vitamin B deficiency, unspecified: Secondary | ICD-10-CM | POA: Diagnosis not present

## 2020-12-31 DIAGNOSIS — K449 Diaphragmatic hernia without obstruction or gangrene: Secondary | ICD-10-CM | POA: Diagnosis not present

## 2020-12-31 DIAGNOSIS — Z79891 Long term (current) use of opiate analgesic: Secondary | ICD-10-CM | POA: Diagnosis not present

## 2020-12-31 DIAGNOSIS — K219 Gastro-esophageal reflux disease without esophagitis: Secondary | ICD-10-CM | POA: Diagnosis not present

## 2020-12-31 DIAGNOSIS — Z7901 Long term (current) use of anticoagulants: Secondary | ICD-10-CM | POA: Diagnosis not present

## 2020-12-31 DIAGNOSIS — G309 Alzheimer's disease, unspecified: Secondary | ICD-10-CM | POA: Diagnosis not present

## 2020-12-31 DIAGNOSIS — M5116 Intervertebral disc disorders with radiculopathy, lumbar region: Secondary | ICD-10-CM | POA: Diagnosis not present

## 2020-12-31 DIAGNOSIS — M170 Bilateral primary osteoarthritis of knee: Secondary | ICD-10-CM | POA: Diagnosis not present

## 2021-01-02 DIAGNOSIS — I482 Chronic atrial fibrillation, unspecified: Secondary | ICD-10-CM | POA: Diagnosis not present

## 2021-01-02 DIAGNOSIS — E539 Vitamin B deficiency, unspecified: Secondary | ICD-10-CM | POA: Diagnosis not present

## 2021-01-02 DIAGNOSIS — M5116 Intervertebral disc disorders with radiculopathy, lumbar region: Secondary | ICD-10-CM | POA: Diagnosis not present

## 2021-01-02 DIAGNOSIS — M170 Bilateral primary osteoarthritis of knee: Secondary | ICD-10-CM | POA: Diagnosis not present

## 2021-01-02 DIAGNOSIS — G309 Alzheimer's disease, unspecified: Secondary | ICD-10-CM | POA: Diagnosis not present

## 2021-01-06 DIAGNOSIS — M5136 Other intervertebral disc degeneration, lumbar region: Secondary | ICD-10-CM | POA: Diagnosis not present

## 2021-01-06 DIAGNOSIS — G309 Alzheimer's disease, unspecified: Secondary | ICD-10-CM | POA: Diagnosis not present

## 2021-01-06 DIAGNOSIS — R2681 Unsteadiness on feet: Secondary | ICD-10-CM | POA: Diagnosis not present

## 2021-01-06 DIAGNOSIS — I10 Essential (primary) hypertension: Secondary | ICD-10-CM | POA: Diagnosis not present

## 2021-01-06 DIAGNOSIS — I4891 Unspecified atrial fibrillation: Secondary | ICD-10-CM | POA: Diagnosis not present

## 2021-01-08 DIAGNOSIS — I482 Chronic atrial fibrillation, unspecified: Secondary | ICD-10-CM | POA: Diagnosis not present

## 2021-01-08 DIAGNOSIS — Z79891 Long term (current) use of opiate analgesic: Secondary | ICD-10-CM | POA: Diagnosis not present

## 2021-01-08 DIAGNOSIS — Z7901 Long term (current) use of anticoagulants: Secondary | ICD-10-CM | POA: Diagnosis not present

## 2021-01-08 DIAGNOSIS — K449 Diaphragmatic hernia without obstruction or gangrene: Secondary | ICD-10-CM | POA: Diagnosis not present

## 2021-01-08 DIAGNOSIS — G309 Alzheimer's disease, unspecified: Secondary | ICD-10-CM | POA: Diagnosis not present

## 2021-01-08 DIAGNOSIS — M5116 Intervertebral disc disorders with radiculopathy, lumbar region: Secondary | ICD-10-CM | POA: Diagnosis not present

## 2021-01-08 DIAGNOSIS — E539 Vitamin B deficiency, unspecified: Secondary | ICD-10-CM | POA: Diagnosis not present

## 2021-01-08 DIAGNOSIS — K219 Gastro-esophageal reflux disease without esophagitis: Secondary | ICD-10-CM | POA: Diagnosis not present

## 2021-01-08 DIAGNOSIS — M170 Bilateral primary osteoarthritis of knee: Secondary | ICD-10-CM | POA: Diagnosis not present

## 2021-01-12 DIAGNOSIS — K449 Diaphragmatic hernia without obstruction or gangrene: Secondary | ICD-10-CM | POA: Diagnosis not present

## 2021-01-12 DIAGNOSIS — I482 Chronic atrial fibrillation, unspecified: Secondary | ICD-10-CM | POA: Diagnosis not present

## 2021-01-12 DIAGNOSIS — E539 Vitamin B deficiency, unspecified: Secondary | ICD-10-CM | POA: Diagnosis not present

## 2021-01-12 DIAGNOSIS — Z7901 Long term (current) use of anticoagulants: Secondary | ICD-10-CM | POA: Diagnosis not present

## 2021-01-12 DIAGNOSIS — G309 Alzheimer's disease, unspecified: Secondary | ICD-10-CM | POA: Diagnosis not present

## 2021-01-12 DIAGNOSIS — K219 Gastro-esophageal reflux disease without esophagitis: Secondary | ICD-10-CM | POA: Diagnosis not present

## 2021-01-12 DIAGNOSIS — M170 Bilateral primary osteoarthritis of knee: Secondary | ICD-10-CM | POA: Diagnosis not present

## 2021-01-12 DIAGNOSIS — M5116 Intervertebral disc disorders with radiculopathy, lumbar region: Secondary | ICD-10-CM | POA: Diagnosis not present

## 2021-01-12 DIAGNOSIS — Z79891 Long term (current) use of opiate analgesic: Secondary | ICD-10-CM | POA: Diagnosis not present

## 2021-01-19 DIAGNOSIS — I10 Essential (primary) hypertension: Secondary | ICD-10-CM | POA: Diagnosis not present

## 2021-01-20 DIAGNOSIS — Z79891 Long term (current) use of opiate analgesic: Secondary | ICD-10-CM | POA: Diagnosis not present

## 2021-01-20 DIAGNOSIS — M170 Bilateral primary osteoarthritis of knee: Secondary | ICD-10-CM | POA: Diagnosis not present

## 2021-01-20 DIAGNOSIS — K219 Gastro-esophageal reflux disease without esophagitis: Secondary | ICD-10-CM | POA: Diagnosis not present

## 2021-01-20 DIAGNOSIS — G309 Alzheimer's disease, unspecified: Secondary | ICD-10-CM | POA: Diagnosis not present

## 2021-01-20 DIAGNOSIS — E539 Vitamin B deficiency, unspecified: Secondary | ICD-10-CM | POA: Diagnosis not present

## 2021-01-20 DIAGNOSIS — K449 Diaphragmatic hernia without obstruction or gangrene: Secondary | ICD-10-CM | POA: Diagnosis not present

## 2021-01-20 DIAGNOSIS — I482 Chronic atrial fibrillation, unspecified: Secondary | ICD-10-CM | POA: Diagnosis not present

## 2021-01-20 DIAGNOSIS — M5116 Intervertebral disc disorders with radiculopathy, lumbar region: Secondary | ICD-10-CM | POA: Diagnosis not present

## 2021-01-20 DIAGNOSIS — Z7901 Long term (current) use of anticoagulants: Secondary | ICD-10-CM | POA: Diagnosis not present

## 2021-01-24 DIAGNOSIS — E559 Vitamin D deficiency, unspecified: Secondary | ICD-10-CM | POA: Diagnosis not present

## 2021-01-24 DIAGNOSIS — I10 Essential (primary) hypertension: Secondary | ICD-10-CM | POA: Diagnosis not present

## 2021-01-24 DIAGNOSIS — E039 Hypothyroidism, unspecified: Secondary | ICD-10-CM | POA: Diagnosis not present

## 2021-01-24 DIAGNOSIS — D518 Other vitamin B12 deficiency anemias: Secondary | ICD-10-CM | POA: Diagnosis not present

## 2021-01-24 DIAGNOSIS — M170 Bilateral primary osteoarthritis of knee: Secondary | ICD-10-CM | POA: Diagnosis not present

## 2021-01-24 DIAGNOSIS — G309 Alzheimer's disease, unspecified: Secondary | ICD-10-CM | POA: Diagnosis not present

## 2021-01-24 DIAGNOSIS — I4891 Unspecified atrial fibrillation: Secondary | ICD-10-CM | POA: Diagnosis not present

## 2021-01-24 DIAGNOSIS — E119 Type 2 diabetes mellitus without complications: Secondary | ICD-10-CM | POA: Diagnosis not present

## 2021-01-24 DIAGNOSIS — E038 Other specified hypothyroidism: Secondary | ICD-10-CM | POA: Diagnosis not present

## 2021-01-29 DIAGNOSIS — M5116 Intervertebral disc disorders with radiculopathy, lumbar region: Secondary | ICD-10-CM | POA: Diagnosis not present

## 2021-01-29 DIAGNOSIS — M170 Bilateral primary osteoarthritis of knee: Secondary | ICD-10-CM | POA: Diagnosis not present

## 2021-01-29 DIAGNOSIS — G309 Alzheimer's disease, unspecified: Secondary | ICD-10-CM | POA: Diagnosis not present

## 2021-01-29 DIAGNOSIS — E539 Vitamin B deficiency, unspecified: Secondary | ICD-10-CM | POA: Diagnosis not present

## 2021-01-29 DIAGNOSIS — Z7901 Long term (current) use of anticoagulants: Secondary | ICD-10-CM | POA: Diagnosis not present

## 2021-01-29 DIAGNOSIS — I482 Chronic atrial fibrillation, unspecified: Secondary | ICD-10-CM | POA: Diagnosis not present

## 2021-01-29 DIAGNOSIS — K219 Gastro-esophageal reflux disease without esophagitis: Secondary | ICD-10-CM | POA: Diagnosis not present

## 2021-01-29 DIAGNOSIS — Z79891 Long term (current) use of opiate analgesic: Secondary | ICD-10-CM | POA: Diagnosis not present

## 2021-01-29 DIAGNOSIS — K449 Diaphragmatic hernia without obstruction or gangrene: Secondary | ICD-10-CM | POA: Diagnosis not present

## 2021-02-02 DIAGNOSIS — K449 Diaphragmatic hernia without obstruction or gangrene: Secondary | ICD-10-CM | POA: Diagnosis not present

## 2021-02-02 DIAGNOSIS — E539 Vitamin B deficiency, unspecified: Secondary | ICD-10-CM | POA: Diagnosis not present

## 2021-02-02 DIAGNOSIS — I482 Chronic atrial fibrillation, unspecified: Secondary | ICD-10-CM | POA: Diagnosis not present

## 2021-02-02 DIAGNOSIS — Z79891 Long term (current) use of opiate analgesic: Secondary | ICD-10-CM | POA: Diagnosis not present

## 2021-02-02 DIAGNOSIS — M5116 Intervertebral disc disorders with radiculopathy, lumbar region: Secondary | ICD-10-CM | POA: Diagnosis not present

## 2021-02-02 DIAGNOSIS — K219 Gastro-esophageal reflux disease without esophagitis: Secondary | ICD-10-CM | POA: Diagnosis not present

## 2021-02-02 DIAGNOSIS — G309 Alzheimer's disease, unspecified: Secondary | ICD-10-CM | POA: Diagnosis not present

## 2021-02-02 DIAGNOSIS — M170 Bilateral primary osteoarthritis of knee: Secondary | ICD-10-CM | POA: Diagnosis not present

## 2021-02-02 DIAGNOSIS — Z7901 Long term (current) use of anticoagulants: Secondary | ICD-10-CM | POA: Diagnosis not present

## 2021-02-03 DIAGNOSIS — I10 Essential (primary) hypertension: Secondary | ICD-10-CM | POA: Diagnosis not present

## 2021-02-03 DIAGNOSIS — K219 Gastro-esophageal reflux disease without esophagitis: Secondary | ICD-10-CM | POA: Diagnosis not present

## 2021-02-03 DIAGNOSIS — M170 Bilateral primary osteoarthritis of knee: Secondary | ICD-10-CM | POA: Diagnosis not present

## 2021-02-03 DIAGNOSIS — I4891 Unspecified atrial fibrillation: Secondary | ICD-10-CM | POA: Diagnosis not present

## 2021-02-03 DIAGNOSIS — M5136 Other intervertebral disc degeneration, lumbar region: Secondary | ICD-10-CM | POA: Diagnosis not present

## 2021-02-03 DIAGNOSIS — G309 Alzheimer's disease, unspecified: Secondary | ICD-10-CM | POA: Diagnosis not present

## 2021-02-06 DIAGNOSIS — D518 Other vitamin B12 deficiency anemias: Secondary | ICD-10-CM | POA: Diagnosis not present

## 2021-02-06 DIAGNOSIS — E7849 Other hyperlipidemia: Secondary | ICD-10-CM | POA: Diagnosis not present

## 2021-02-06 DIAGNOSIS — E119 Type 2 diabetes mellitus without complications: Secondary | ICD-10-CM | POA: Diagnosis not present

## 2021-02-06 DIAGNOSIS — E038 Other specified hypothyroidism: Secondary | ICD-10-CM | POA: Diagnosis not present

## 2021-02-06 DIAGNOSIS — Z79899 Other long term (current) drug therapy: Secondary | ICD-10-CM | POA: Diagnosis not present

## 2021-02-09 DIAGNOSIS — I482 Chronic atrial fibrillation, unspecified: Secondary | ICD-10-CM | POA: Diagnosis not present

## 2021-02-09 DIAGNOSIS — E539 Vitamin B deficiency, unspecified: Secondary | ICD-10-CM | POA: Diagnosis not present

## 2021-02-09 DIAGNOSIS — K449 Diaphragmatic hernia without obstruction or gangrene: Secondary | ICD-10-CM | POA: Diagnosis not present

## 2021-02-09 DIAGNOSIS — K219 Gastro-esophageal reflux disease without esophagitis: Secondary | ICD-10-CM | POA: Diagnosis not present

## 2021-02-09 DIAGNOSIS — G309 Alzheimer's disease, unspecified: Secondary | ICD-10-CM | POA: Diagnosis not present

## 2021-02-09 DIAGNOSIS — Z7901 Long term (current) use of anticoagulants: Secondary | ICD-10-CM | POA: Diagnosis not present

## 2021-02-09 DIAGNOSIS — M170 Bilateral primary osteoarthritis of knee: Secondary | ICD-10-CM | POA: Diagnosis not present

## 2021-02-09 DIAGNOSIS — M5116 Intervertebral disc disorders with radiculopathy, lumbar region: Secondary | ICD-10-CM | POA: Diagnosis not present

## 2021-02-09 DIAGNOSIS — Z79891 Long term (current) use of opiate analgesic: Secondary | ICD-10-CM | POA: Diagnosis not present

## 2021-02-12 ENCOUNTER — Ambulatory Visit (INDEPENDENT_AMBULATORY_CARE_PROVIDER_SITE_OTHER): Payer: Medicare Other | Admitting: Podiatry

## 2021-02-12 ENCOUNTER — Other Ambulatory Visit: Payer: Self-pay

## 2021-02-12 ENCOUNTER — Encounter: Payer: Self-pay | Admitting: Podiatry

## 2021-02-12 DIAGNOSIS — N1832 Chronic kidney disease, stage 3b: Secondary | ICD-10-CM

## 2021-02-12 DIAGNOSIS — B351 Tinea unguium: Secondary | ICD-10-CM

## 2021-02-12 NOTE — Progress Notes (Signed)
  Subjective:  Patient ID: Bertram Gala, female    DOB: October 18, 1921,  MRN: [id number]  Chief Complaint  Patient presents with   Nail Problem    Trim nails    87 y.o. female presents with the above complaint. History confirmed with patient.   Objective:  Physical Exam: warm, good capillary refill, nail exam onychomycosis of the toenails, no trophic changes or ulcerative lesions. DP pulses palpable, PT pulses palpable, and protective sensation intact Left Foot: normal exam, no swelling, tenderness, instability; ligaments intact, full range of motion of all ankle/foot joints  Right Foot: normal exam, no swelling, tenderness, instability; ligaments intact, full range of motion of all ankle/foot joints   No images are attached to the encounter.  Assessment:   1. Onychomycosis   2. Chronic kidney disease, stage 3b ([place])    Plan:  Patient was evaluated and treated and all questions answered.  Onychomycosis, CKD -Nails debrided x10  Procedure: Nail Debridement Type of Debridement: manual, sharp debridement. Instrumentation: Nail nipper, rotary burr. Number of Nails: 10  No follow-ups on file.

## 2021-02-16 DIAGNOSIS — K219 Gastro-esophageal reflux disease without esophagitis: Secondary | ICD-10-CM | POA: Diagnosis not present

## 2021-02-16 DIAGNOSIS — Z79891 Long term (current) use of opiate analgesic: Secondary | ICD-10-CM | POA: Diagnosis not present

## 2021-02-16 DIAGNOSIS — I482 Chronic atrial fibrillation, unspecified: Secondary | ICD-10-CM | POA: Diagnosis not present

## 2021-02-16 DIAGNOSIS — G309 Alzheimer's disease, unspecified: Secondary | ICD-10-CM | POA: Diagnosis not present

## 2021-02-16 DIAGNOSIS — E539 Vitamin B deficiency, unspecified: Secondary | ICD-10-CM | POA: Diagnosis not present

## 2021-02-16 DIAGNOSIS — Z7901 Long term (current) use of anticoagulants: Secondary | ICD-10-CM | POA: Diagnosis not present

## 2021-02-16 DIAGNOSIS — M5116 Intervertebral disc disorders with radiculopathy, lumbar region: Secondary | ICD-10-CM | POA: Diagnosis not present

## 2021-02-16 DIAGNOSIS — M170 Bilateral primary osteoarthritis of knee: Secondary | ICD-10-CM | POA: Diagnosis not present

## 2021-02-16 DIAGNOSIS — K449 Diaphragmatic hernia without obstruction or gangrene: Secondary | ICD-10-CM | POA: Diagnosis not present

## 2021-02-19 NOTE — Addendum Note (Signed)
Addended by: Cranford Mon R on: 02/19/2021 08:36 AM   Modules accepted: Orders

## 2021-02-23 DIAGNOSIS — K219 Gastro-esophageal reflux disease without esophagitis: Secondary | ICD-10-CM | POA: Diagnosis not present

## 2021-02-23 DIAGNOSIS — Z79891 Long term (current) use of opiate analgesic: Secondary | ICD-10-CM | POA: Diagnosis not present

## 2021-02-23 DIAGNOSIS — I482 Chronic atrial fibrillation, unspecified: Secondary | ICD-10-CM | POA: Diagnosis not present

## 2021-02-23 DIAGNOSIS — M5116 Intervertebral disc disorders with radiculopathy, lumbar region: Secondary | ICD-10-CM | POA: Diagnosis not present

## 2021-02-23 DIAGNOSIS — E539 Vitamin B deficiency, unspecified: Secondary | ICD-10-CM | POA: Diagnosis not present

## 2021-02-23 DIAGNOSIS — G309 Alzheimer's disease, unspecified: Secondary | ICD-10-CM | POA: Diagnosis not present

## 2021-02-23 DIAGNOSIS — Z7901 Long term (current) use of anticoagulants: Secondary | ICD-10-CM | POA: Diagnosis not present

## 2021-02-23 DIAGNOSIS — K449 Diaphragmatic hernia without obstruction or gangrene: Secondary | ICD-10-CM | POA: Diagnosis not present

## 2021-02-23 DIAGNOSIS — M170 Bilateral primary osteoarthritis of knee: Secondary | ICD-10-CM | POA: Diagnosis not present

## 2021-02-26 DIAGNOSIS — E038 Other specified hypothyroidism: Secondary | ICD-10-CM | POA: Diagnosis not present

## 2021-02-26 DIAGNOSIS — E119 Type 2 diabetes mellitus without complications: Secondary | ICD-10-CM | POA: Diagnosis not present

## 2021-02-26 DIAGNOSIS — E559 Vitamin D deficiency, unspecified: Secondary | ICD-10-CM | POA: Diagnosis not present

## 2021-02-26 DIAGNOSIS — D518 Other vitamin B12 deficiency anemias: Secondary | ICD-10-CM | POA: Diagnosis not present

## 2021-02-26 DIAGNOSIS — G309 Alzheimer's disease, unspecified: Secondary | ICD-10-CM | POA: Diagnosis not present

## 2021-02-26 DIAGNOSIS — I4891 Unspecified atrial fibrillation: Secondary | ICD-10-CM | POA: Diagnosis not present

## 2021-02-26 DIAGNOSIS — I10 Essential (primary) hypertension: Secondary | ICD-10-CM | POA: Diagnosis not present

## 2021-02-26 DIAGNOSIS — E039 Hypothyroidism, unspecified: Secondary | ICD-10-CM | POA: Diagnosis not present

## 2021-02-26 DIAGNOSIS — M170 Bilateral primary osteoarthritis of knee: Secondary | ICD-10-CM | POA: Diagnosis not present

## 2021-03-02 DIAGNOSIS — K219 Gastro-esophageal reflux disease without esophagitis: Secondary | ICD-10-CM | POA: Diagnosis not present

## 2021-03-02 DIAGNOSIS — G309 Alzheimer's disease, unspecified: Secondary | ICD-10-CM | POA: Diagnosis not present

## 2021-03-02 DIAGNOSIS — E539 Vitamin B deficiency, unspecified: Secondary | ICD-10-CM | POA: Diagnosis not present

## 2021-03-02 DIAGNOSIS — M5116 Intervertebral disc disorders with radiculopathy, lumbar region: Secondary | ICD-10-CM | POA: Diagnosis not present

## 2021-03-02 DIAGNOSIS — Z7901 Long term (current) use of anticoagulants: Secondary | ICD-10-CM | POA: Diagnosis not present

## 2021-03-02 DIAGNOSIS — Z79891 Long term (current) use of opiate analgesic: Secondary | ICD-10-CM | POA: Diagnosis not present

## 2021-03-02 DIAGNOSIS — K449 Diaphragmatic hernia without obstruction or gangrene: Secondary | ICD-10-CM | POA: Diagnosis not present

## 2021-03-02 DIAGNOSIS — I482 Chronic atrial fibrillation, unspecified: Secondary | ICD-10-CM | POA: Diagnosis not present

## 2021-03-02 DIAGNOSIS — M170 Bilateral primary osteoarthritis of knee: Secondary | ICD-10-CM | POA: Diagnosis not present

## 2021-03-03 DIAGNOSIS — K219 Gastro-esophageal reflux disease without esophagitis: Secondary | ICD-10-CM | POA: Diagnosis not present

## 2021-03-03 DIAGNOSIS — E039 Hypothyroidism, unspecified: Secondary | ICD-10-CM | POA: Diagnosis not present

## 2021-03-03 DIAGNOSIS — I10 Essential (primary) hypertension: Secondary | ICD-10-CM | POA: Diagnosis not present

## 2021-03-03 DIAGNOSIS — I4891 Unspecified atrial fibrillation: Secondary | ICD-10-CM | POA: Diagnosis not present

## 2021-03-03 DIAGNOSIS — M170 Bilateral primary osteoarthritis of knee: Secondary | ICD-10-CM | POA: Diagnosis not present

## 2021-03-03 DIAGNOSIS — G309 Alzheimer's disease, unspecified: Secondary | ICD-10-CM | POA: Diagnosis not present

## 2021-03-04 DIAGNOSIS — G309 Alzheimer's disease, unspecified: Secondary | ICD-10-CM | POA: Diagnosis not present

## 2021-03-04 DIAGNOSIS — M170 Bilateral primary osteoarthritis of knee: Secondary | ICD-10-CM | POA: Diagnosis not present

## 2021-03-04 DIAGNOSIS — I482 Chronic atrial fibrillation, unspecified: Secondary | ICD-10-CM | POA: Diagnosis not present

## 2021-03-04 DIAGNOSIS — M5116 Intervertebral disc disorders with radiculopathy, lumbar region: Secondary | ICD-10-CM | POA: Diagnosis not present

## 2021-03-04 DIAGNOSIS — E539 Vitamin B deficiency, unspecified: Secondary | ICD-10-CM | POA: Diagnosis not present

## 2021-03-09 DIAGNOSIS — I482 Chronic atrial fibrillation, unspecified: Secondary | ICD-10-CM | POA: Diagnosis not present

## 2021-03-09 DIAGNOSIS — K219 Gastro-esophageal reflux disease without esophagitis: Secondary | ICD-10-CM | POA: Diagnosis not present

## 2021-03-09 DIAGNOSIS — M170 Bilateral primary osteoarthritis of knee: Secondary | ICD-10-CM | POA: Diagnosis not present

## 2021-03-09 DIAGNOSIS — M5116 Intervertebral disc disorders with radiculopathy, lumbar region: Secondary | ICD-10-CM | POA: Diagnosis not present

## 2021-03-09 DIAGNOSIS — E539 Vitamin B deficiency, unspecified: Secondary | ICD-10-CM | POA: Diagnosis not present

## 2021-03-09 DIAGNOSIS — K449 Diaphragmatic hernia without obstruction or gangrene: Secondary | ICD-10-CM | POA: Diagnosis not present

## 2021-03-09 DIAGNOSIS — Z7901 Long term (current) use of anticoagulants: Secondary | ICD-10-CM | POA: Diagnosis not present

## 2021-03-09 DIAGNOSIS — Z79891 Long term (current) use of opiate analgesic: Secondary | ICD-10-CM | POA: Diagnosis not present

## 2021-03-09 DIAGNOSIS — G309 Alzheimer's disease, unspecified: Secondary | ICD-10-CM | POA: Diagnosis not present

## 2021-03-16 DIAGNOSIS — G309 Alzheimer's disease, unspecified: Secondary | ICD-10-CM | POA: Diagnosis not present

## 2021-03-16 DIAGNOSIS — M170 Bilateral primary osteoarthritis of knee: Secondary | ICD-10-CM | POA: Diagnosis not present

## 2021-03-16 DIAGNOSIS — K219 Gastro-esophageal reflux disease without esophagitis: Secondary | ICD-10-CM | POA: Diagnosis not present

## 2021-03-16 DIAGNOSIS — Z7901 Long term (current) use of anticoagulants: Secondary | ICD-10-CM | POA: Diagnosis not present

## 2021-03-16 DIAGNOSIS — Z79891 Long term (current) use of opiate analgesic: Secondary | ICD-10-CM | POA: Diagnosis not present

## 2021-03-16 DIAGNOSIS — E539 Vitamin B deficiency, unspecified: Secondary | ICD-10-CM | POA: Diagnosis not present

## 2021-03-16 DIAGNOSIS — K449 Diaphragmatic hernia without obstruction or gangrene: Secondary | ICD-10-CM | POA: Diagnosis not present

## 2021-03-16 DIAGNOSIS — I482 Chronic atrial fibrillation, unspecified: Secondary | ICD-10-CM | POA: Diagnosis not present

## 2021-03-16 DIAGNOSIS — M5116 Intervertebral disc disorders with radiculopathy, lumbar region: Secondary | ICD-10-CM | POA: Diagnosis not present

## 2021-03-23 DIAGNOSIS — M170 Bilateral primary osteoarthritis of knee: Secondary | ICD-10-CM | POA: Diagnosis not present

## 2021-03-23 DIAGNOSIS — M5116 Intervertebral disc disorders with radiculopathy, lumbar region: Secondary | ICD-10-CM | POA: Diagnosis not present

## 2021-03-23 DIAGNOSIS — I482 Chronic atrial fibrillation, unspecified: Secondary | ICD-10-CM | POA: Diagnosis not present

## 2021-03-23 DIAGNOSIS — E539 Vitamin B deficiency, unspecified: Secondary | ICD-10-CM | POA: Diagnosis not present

## 2021-03-23 DIAGNOSIS — Z7901 Long term (current) use of anticoagulants: Secondary | ICD-10-CM | POA: Diagnosis not present

## 2021-03-23 DIAGNOSIS — K219 Gastro-esophageal reflux disease without esophagitis: Secondary | ICD-10-CM | POA: Diagnosis not present

## 2021-03-23 DIAGNOSIS — Z79891 Long term (current) use of opiate analgesic: Secondary | ICD-10-CM | POA: Diagnosis not present

## 2021-03-23 DIAGNOSIS — K449 Diaphragmatic hernia without obstruction or gangrene: Secondary | ICD-10-CM | POA: Diagnosis not present

## 2021-03-23 DIAGNOSIS — G309 Alzheimer's disease, unspecified: Secondary | ICD-10-CM | POA: Diagnosis not present

## 2021-03-28 DIAGNOSIS — E119 Type 2 diabetes mellitus without complications: Secondary | ICD-10-CM | POA: Diagnosis not present

## 2021-03-28 DIAGNOSIS — I4891 Unspecified atrial fibrillation: Secondary | ICD-10-CM | POA: Diagnosis not present

## 2021-03-28 DIAGNOSIS — E559 Vitamin D deficiency, unspecified: Secondary | ICD-10-CM | POA: Diagnosis not present

## 2021-03-28 DIAGNOSIS — G309 Alzheimer's disease, unspecified: Secondary | ICD-10-CM | POA: Diagnosis not present

## 2021-03-28 DIAGNOSIS — E039 Hypothyroidism, unspecified: Secondary | ICD-10-CM | POA: Diagnosis not present

## 2021-03-28 DIAGNOSIS — E038 Other specified hypothyroidism: Secondary | ICD-10-CM | POA: Diagnosis not present

## 2021-03-28 DIAGNOSIS — D518 Other vitamin B12 deficiency anemias: Secondary | ICD-10-CM | POA: Diagnosis not present

## 2021-03-28 DIAGNOSIS — I10 Essential (primary) hypertension: Secondary | ICD-10-CM | POA: Diagnosis not present

## 2021-03-28 DIAGNOSIS — M170 Bilateral primary osteoarthritis of knee: Secondary | ICD-10-CM | POA: Diagnosis not present

## 2021-03-31 DIAGNOSIS — K219 Gastro-esophageal reflux disease without esophagitis: Secondary | ICD-10-CM | POA: Diagnosis not present

## 2021-03-31 DIAGNOSIS — E039 Hypothyroidism, unspecified: Secondary | ICD-10-CM | POA: Diagnosis not present

## 2021-03-31 DIAGNOSIS — I4891 Unspecified atrial fibrillation: Secondary | ICD-10-CM | POA: Diagnosis not present

## 2021-03-31 DIAGNOSIS — M170 Bilateral primary osteoarthritis of knee: Secondary | ICD-10-CM | POA: Diagnosis not present

## 2021-03-31 DIAGNOSIS — I10 Essential (primary) hypertension: Secondary | ICD-10-CM | POA: Diagnosis not present

## 2021-03-31 DIAGNOSIS — G309 Alzheimer's disease, unspecified: Secondary | ICD-10-CM | POA: Diagnosis not present

## 2021-03-31 DIAGNOSIS — M5136 Other intervertebral disc degeneration, lumbar region: Secondary | ICD-10-CM | POA: Diagnosis not present

## 2021-04-01 DIAGNOSIS — E539 Vitamin B deficiency, unspecified: Secondary | ICD-10-CM | POA: Diagnosis not present

## 2021-04-01 DIAGNOSIS — M5116 Intervertebral disc disorders with radiculopathy, lumbar region: Secondary | ICD-10-CM | POA: Diagnosis not present

## 2021-04-01 DIAGNOSIS — G309 Alzheimer's disease, unspecified: Secondary | ICD-10-CM | POA: Diagnosis not present

## 2021-04-01 DIAGNOSIS — Z7901 Long term (current) use of anticoagulants: Secondary | ICD-10-CM | POA: Diagnosis not present

## 2021-04-01 DIAGNOSIS — M170 Bilateral primary osteoarthritis of knee: Secondary | ICD-10-CM | POA: Diagnosis not present

## 2021-04-01 DIAGNOSIS — K449 Diaphragmatic hernia without obstruction or gangrene: Secondary | ICD-10-CM | POA: Diagnosis not present

## 2021-04-01 DIAGNOSIS — K219 Gastro-esophageal reflux disease without esophagitis: Secondary | ICD-10-CM | POA: Diagnosis not present

## 2021-04-01 DIAGNOSIS — I482 Chronic atrial fibrillation, unspecified: Secondary | ICD-10-CM | POA: Diagnosis not present

## 2021-04-01 DIAGNOSIS — Z79891 Long term (current) use of opiate analgesic: Secondary | ICD-10-CM | POA: Diagnosis not present

## 2021-04-09 DIAGNOSIS — K219 Gastro-esophageal reflux disease without esophagitis: Secondary | ICD-10-CM | POA: Diagnosis not present

## 2021-04-09 DIAGNOSIS — M5116 Intervertebral disc disorders with radiculopathy, lumbar region: Secondary | ICD-10-CM | POA: Diagnosis not present

## 2021-04-09 DIAGNOSIS — Z79891 Long term (current) use of opiate analgesic: Secondary | ICD-10-CM | POA: Diagnosis not present

## 2021-04-09 DIAGNOSIS — M170 Bilateral primary osteoarthritis of knee: Secondary | ICD-10-CM | POA: Diagnosis not present

## 2021-04-09 DIAGNOSIS — E539 Vitamin B deficiency, unspecified: Secondary | ICD-10-CM | POA: Diagnosis not present

## 2021-04-09 DIAGNOSIS — Z7901 Long term (current) use of anticoagulants: Secondary | ICD-10-CM | POA: Diagnosis not present

## 2021-04-09 DIAGNOSIS — G309 Alzheimer's disease, unspecified: Secondary | ICD-10-CM | POA: Diagnosis not present

## 2021-04-09 DIAGNOSIS — I482 Chronic atrial fibrillation, unspecified: Secondary | ICD-10-CM | POA: Diagnosis not present

## 2021-04-09 DIAGNOSIS — K449 Diaphragmatic hernia without obstruction or gangrene: Secondary | ICD-10-CM | POA: Diagnosis not present

## 2021-04-20 DIAGNOSIS — I10 Essential (primary) hypertension: Secondary | ICD-10-CM | POA: Diagnosis not present

## 2021-04-26 DIAGNOSIS — D518 Other vitamin B12 deficiency anemias: Secondary | ICD-10-CM | POA: Diagnosis not present

## 2021-04-26 DIAGNOSIS — E039 Hypothyroidism, unspecified: Secondary | ICD-10-CM | POA: Diagnosis not present

## 2021-04-26 DIAGNOSIS — E559 Vitamin D deficiency, unspecified: Secondary | ICD-10-CM | POA: Diagnosis not present

## 2021-04-26 DIAGNOSIS — I4891 Unspecified atrial fibrillation: Secondary | ICD-10-CM | POA: Diagnosis not present

## 2021-04-26 DIAGNOSIS — M170 Bilateral primary osteoarthritis of knee: Secondary | ICD-10-CM | POA: Diagnosis not present

## 2021-04-26 DIAGNOSIS — E119 Type 2 diabetes mellitus without complications: Secondary | ICD-10-CM | POA: Diagnosis not present

## 2021-04-26 DIAGNOSIS — I10 Essential (primary) hypertension: Secondary | ICD-10-CM | POA: Diagnosis not present

## 2021-04-26 DIAGNOSIS — G309 Alzheimer's disease, unspecified: Secondary | ICD-10-CM | POA: Diagnosis not present

## 2021-04-26 DIAGNOSIS — E038 Other specified hypothyroidism: Secondary | ICD-10-CM | POA: Diagnosis not present

## 2021-04-28 DIAGNOSIS — R5383 Other fatigue: Secondary | ICD-10-CM | POA: Diagnosis not present

## 2021-04-28 DIAGNOSIS — G309 Alzheimer's disease, unspecified: Secondary | ICD-10-CM | POA: Diagnosis not present

## 2021-04-28 DIAGNOSIS — E039 Hypothyroidism, unspecified: Secondary | ICD-10-CM | POA: Diagnosis not present

## 2021-04-28 DIAGNOSIS — I4891 Unspecified atrial fibrillation: Secondary | ICD-10-CM | POA: Diagnosis not present

## 2021-04-28 DIAGNOSIS — K219 Gastro-esophageal reflux disease without esophagitis: Secondary | ICD-10-CM | POA: Diagnosis not present

## 2021-04-28 DIAGNOSIS — N390 Urinary tract infection, site not specified: Secondary | ICD-10-CM | POA: Diagnosis not present

## 2021-04-30 DIAGNOSIS — E119 Type 2 diabetes mellitus without complications: Secondary | ICD-10-CM | POA: Diagnosis not present

## 2021-04-30 DIAGNOSIS — E7849 Other hyperlipidemia: Secondary | ICD-10-CM | POA: Diagnosis not present

## 2021-04-30 DIAGNOSIS — E038 Other specified hypothyroidism: Secondary | ICD-10-CM | POA: Diagnosis not present

## 2021-04-30 DIAGNOSIS — Z79899 Other long term (current) drug therapy: Secondary | ICD-10-CM | POA: Diagnosis not present

## 2021-04-30 DIAGNOSIS — D518 Other vitamin B12 deficiency anemias: Secondary | ICD-10-CM | POA: Diagnosis not present

## 2021-05-01 DIAGNOSIS — N390 Urinary tract infection, site not specified: Secondary | ICD-10-CM | POA: Diagnosis not present

## 2021-05-19 DIAGNOSIS — B351 Tinea unguium: Secondary | ICD-10-CM | POA: Diagnosis not present

## 2021-05-19 DIAGNOSIS — I7091 Generalized atherosclerosis: Secondary | ICD-10-CM | POA: Diagnosis not present

## 2021-05-20 DIAGNOSIS — I10 Essential (primary) hypertension: Secondary | ICD-10-CM | POA: Diagnosis not present

## 2021-05-26 DIAGNOSIS — K219 Gastro-esophageal reflux disease without esophagitis: Secondary | ICD-10-CM | POA: Diagnosis not present

## 2021-05-26 DIAGNOSIS — G309 Alzheimer's disease, unspecified: Secondary | ICD-10-CM | POA: Diagnosis not present

## 2021-05-26 DIAGNOSIS — I10 Essential (primary) hypertension: Secondary | ICD-10-CM | POA: Diagnosis not present

## 2021-05-26 DIAGNOSIS — E039 Hypothyroidism, unspecified: Secondary | ICD-10-CM | POA: Diagnosis not present

## 2021-05-26 DIAGNOSIS — M170 Bilateral primary osteoarthritis of knee: Secondary | ICD-10-CM | POA: Diagnosis not present

## 2021-05-26 DIAGNOSIS — I4891 Unspecified atrial fibrillation: Secondary | ICD-10-CM | POA: Diagnosis not present

## 2021-05-26 DIAGNOSIS — M5136 Other intervertebral disc degeneration, lumbar region: Secondary | ICD-10-CM | POA: Diagnosis not present

## 2021-05-30 DIAGNOSIS — M170 Bilateral primary osteoarthritis of knee: Secondary | ICD-10-CM | POA: Diagnosis not present

## 2021-05-30 DIAGNOSIS — E038 Other specified hypothyroidism: Secondary | ICD-10-CM | POA: Diagnosis not present

## 2021-05-30 DIAGNOSIS — D518 Other vitamin B12 deficiency anemias: Secondary | ICD-10-CM | POA: Diagnosis not present

## 2021-05-30 DIAGNOSIS — E559 Vitamin D deficiency, unspecified: Secondary | ICD-10-CM | POA: Diagnosis not present

## 2021-05-30 DIAGNOSIS — G309 Alzheimer's disease, unspecified: Secondary | ICD-10-CM | POA: Diagnosis not present

## 2021-05-30 DIAGNOSIS — E039 Hypothyroidism, unspecified: Secondary | ICD-10-CM | POA: Diagnosis not present

## 2021-05-30 DIAGNOSIS — I10 Essential (primary) hypertension: Secondary | ICD-10-CM | POA: Diagnosis not present

## 2021-05-30 DIAGNOSIS — I4891 Unspecified atrial fibrillation: Secondary | ICD-10-CM | POA: Diagnosis not present

## 2021-05-30 DIAGNOSIS — E119 Type 2 diabetes mellitus without complications: Secondary | ICD-10-CM | POA: Diagnosis not present

## 2021-06-18 DIAGNOSIS — I10 Essential (primary) hypertension: Secondary | ICD-10-CM | POA: Diagnosis not present

## 2021-06-23 DIAGNOSIS — M5136 Other intervertebral disc degeneration, lumbar region: Secondary | ICD-10-CM | POA: Diagnosis not present

## 2021-06-23 DIAGNOSIS — I4891 Unspecified atrial fibrillation: Secondary | ICD-10-CM | POA: Diagnosis not present

## 2021-06-23 DIAGNOSIS — I10 Essential (primary) hypertension: Secondary | ICD-10-CM | POA: Diagnosis not present

## 2021-06-23 DIAGNOSIS — G309 Alzheimer's disease, unspecified: Secondary | ICD-10-CM | POA: Diagnosis not present

## 2021-06-23 DIAGNOSIS — E039 Hypothyroidism, unspecified: Secondary | ICD-10-CM | POA: Diagnosis not present

## 2021-07-02 DIAGNOSIS — U071 COVID-19: Secondary | ICD-10-CM | POA: Diagnosis not present

## 2021-07-09 DIAGNOSIS — U071 COVID-19: Secondary | ICD-10-CM | POA: Diagnosis not present

## 2021-07-15 DIAGNOSIS — E039 Hypothyroidism, unspecified: Secondary | ICD-10-CM | POA: Diagnosis not present

## 2021-07-15 DIAGNOSIS — E119 Type 2 diabetes mellitus without complications: Secondary | ICD-10-CM | POA: Diagnosis not present

## 2021-07-15 DIAGNOSIS — D518 Other vitamin B12 deficiency anemias: Secondary | ICD-10-CM | POA: Diagnosis not present

## 2021-07-15 DIAGNOSIS — I4891 Unspecified atrial fibrillation: Secondary | ICD-10-CM | POA: Diagnosis not present

## 2021-07-15 DIAGNOSIS — M170 Bilateral primary osteoarthritis of knee: Secondary | ICD-10-CM | POA: Diagnosis not present

## 2021-07-15 DIAGNOSIS — I10 Essential (primary) hypertension: Secondary | ICD-10-CM | POA: Diagnosis not present

## 2021-07-15 DIAGNOSIS — E559 Vitamin D deficiency, unspecified: Secondary | ICD-10-CM | POA: Diagnosis not present

## 2021-07-15 DIAGNOSIS — G309 Alzheimer's disease, unspecified: Secondary | ICD-10-CM | POA: Diagnosis not present

## 2021-07-15 DIAGNOSIS — E038 Other specified hypothyroidism: Secondary | ICD-10-CM | POA: Diagnosis not present

## 2021-07-15 DIAGNOSIS — U071 COVID-19: Secondary | ICD-10-CM | POA: Diagnosis not present

## 2021-07-16 DIAGNOSIS — I10 Essential (primary) hypertension: Secondary | ICD-10-CM | POA: Diagnosis not present

## 2021-07-21 DIAGNOSIS — I4891 Unspecified atrial fibrillation: Secondary | ICD-10-CM | POA: Diagnosis not present

## 2021-07-21 DIAGNOSIS — E039 Hypothyroidism, unspecified: Secondary | ICD-10-CM | POA: Diagnosis not present

## 2021-07-21 DIAGNOSIS — I10 Essential (primary) hypertension: Secondary | ICD-10-CM | POA: Diagnosis not present

## 2021-07-21 DIAGNOSIS — K219 Gastro-esophageal reflux disease without esophagitis: Secondary | ICD-10-CM | POA: Diagnosis not present

## 2021-07-21 DIAGNOSIS — G309 Alzheimer's disease, unspecified: Secondary | ICD-10-CM | POA: Diagnosis not present

## 2021-07-21 DIAGNOSIS — M5136 Other intervertebral disc degeneration, lumbar region: Secondary | ICD-10-CM | POA: Diagnosis not present

## 2021-07-22 DIAGNOSIS — I7091 Generalized atherosclerosis: Secondary | ICD-10-CM | POA: Diagnosis not present

## 2021-07-22 DIAGNOSIS — B351 Tinea unguium: Secondary | ICD-10-CM | POA: Diagnosis not present

## 2021-07-31 DIAGNOSIS — E039 Hypothyroidism, unspecified: Secondary | ICD-10-CM | POA: Diagnosis not present

## 2021-07-31 DIAGNOSIS — G309 Alzheimer's disease, unspecified: Secondary | ICD-10-CM | POA: Diagnosis not present

## 2021-07-31 DIAGNOSIS — E119 Type 2 diabetes mellitus without complications: Secondary | ICD-10-CM | POA: Diagnosis not present

## 2021-07-31 DIAGNOSIS — I10 Essential (primary) hypertension: Secondary | ICD-10-CM | POA: Diagnosis not present

## 2021-07-31 DIAGNOSIS — E559 Vitamin D deficiency, unspecified: Secondary | ICD-10-CM | POA: Diagnosis not present

## 2021-07-31 DIAGNOSIS — I4891 Unspecified atrial fibrillation: Secondary | ICD-10-CM | POA: Diagnosis not present

## 2021-07-31 DIAGNOSIS — E038 Other specified hypothyroidism: Secondary | ICD-10-CM | POA: Diagnosis not present

## 2021-07-31 DIAGNOSIS — D518 Other vitamin B12 deficiency anemias: Secondary | ICD-10-CM | POA: Diagnosis not present

## 2021-07-31 DIAGNOSIS — M170 Bilateral primary osteoarthritis of knee: Secondary | ICD-10-CM | POA: Diagnosis not present

## 2021-08-16 DIAGNOSIS — I10 Essential (primary) hypertension: Secondary | ICD-10-CM | POA: Diagnosis not present

## 2021-08-18 DIAGNOSIS — E039 Hypothyroidism, unspecified: Secondary | ICD-10-CM | POA: Diagnosis not present

## 2021-08-18 DIAGNOSIS — M5136 Other intervertebral disc degeneration, lumbar region: Secondary | ICD-10-CM | POA: Diagnosis not present

## 2021-08-18 DIAGNOSIS — I4891 Unspecified atrial fibrillation: Secondary | ICD-10-CM | POA: Diagnosis not present

## 2021-08-18 DIAGNOSIS — I10 Essential (primary) hypertension: Secondary | ICD-10-CM | POA: Diagnosis not present

## 2021-08-26 DIAGNOSIS — E559 Vitamin D deficiency, unspecified: Secondary | ICD-10-CM | POA: Diagnosis not present

## 2021-08-26 DIAGNOSIS — D518 Other vitamin B12 deficiency anemias: Secondary | ICD-10-CM | POA: Diagnosis not present

## 2021-08-26 DIAGNOSIS — E038 Other specified hypothyroidism: Secondary | ICD-10-CM | POA: Diagnosis not present

## 2021-08-26 DIAGNOSIS — E119 Type 2 diabetes mellitus without complications: Secondary | ICD-10-CM | POA: Diagnosis not present

## 2021-08-26 DIAGNOSIS — I4891 Unspecified atrial fibrillation: Secondary | ICD-10-CM | POA: Diagnosis not present

## 2021-08-26 DIAGNOSIS — E039 Hypothyroidism, unspecified: Secondary | ICD-10-CM | POA: Diagnosis not present

## 2021-08-26 DIAGNOSIS — M170 Bilateral primary osteoarthritis of knee: Secondary | ICD-10-CM | POA: Diagnosis not present

## 2021-08-26 DIAGNOSIS — I10 Essential (primary) hypertension: Secondary | ICD-10-CM | POA: Diagnosis not present

## 2021-08-26 DIAGNOSIS — G309 Alzheimer's disease, unspecified: Secondary | ICD-10-CM | POA: Diagnosis not present

## 2021-09-13 DIAGNOSIS — I10 Essential (primary) hypertension: Secondary | ICD-10-CM | POA: Diagnosis not present

## 2021-09-15 DIAGNOSIS — E039 Hypothyroidism, unspecified: Secondary | ICD-10-CM | POA: Diagnosis not present

## 2021-09-15 DIAGNOSIS — K219 Gastro-esophageal reflux disease without esophagitis: Secondary | ICD-10-CM | POA: Diagnosis not present

## 2021-09-15 DIAGNOSIS — G309 Alzheimer's disease, unspecified: Secondary | ICD-10-CM | POA: Diagnosis not present

## 2021-09-15 DIAGNOSIS — I4891 Unspecified atrial fibrillation: Secondary | ICD-10-CM | POA: Diagnosis not present

## 2021-10-01 DIAGNOSIS — I10 Essential (primary) hypertension: Secondary | ICD-10-CM | POA: Diagnosis not present

## 2021-10-01 DIAGNOSIS — D518 Other vitamin B12 deficiency anemias: Secondary | ICD-10-CM | POA: Diagnosis not present

## 2021-10-01 DIAGNOSIS — E119 Type 2 diabetes mellitus without complications: Secondary | ICD-10-CM | POA: Diagnosis not present

## 2021-10-01 DIAGNOSIS — E039 Hypothyroidism, unspecified: Secondary | ICD-10-CM | POA: Diagnosis not present

## 2021-10-01 DIAGNOSIS — E038 Other specified hypothyroidism: Secondary | ICD-10-CM | POA: Diagnosis not present

## 2021-10-01 DIAGNOSIS — I4891 Unspecified atrial fibrillation: Secondary | ICD-10-CM | POA: Diagnosis not present

## 2021-10-01 DIAGNOSIS — M170 Bilateral primary osteoarthritis of knee: Secondary | ICD-10-CM | POA: Diagnosis not present

## 2021-10-01 DIAGNOSIS — G309 Alzheimer's disease, unspecified: Secondary | ICD-10-CM | POA: Diagnosis not present

## 2021-10-01 DIAGNOSIS — E559 Vitamin D deficiency, unspecified: Secondary | ICD-10-CM | POA: Diagnosis not present

## 2021-10-14 DIAGNOSIS — B351 Tinea unguium: Secondary | ICD-10-CM | POA: Diagnosis not present

## 2021-10-14 DIAGNOSIS — M79671 Pain in right foot: Secondary | ICD-10-CM | POA: Diagnosis not present

## 2021-10-14 DIAGNOSIS — M76829 Posterior tibial tendinitis, unspecified leg: Secondary | ICD-10-CM | POA: Diagnosis not present

## 2021-10-14 DIAGNOSIS — M79672 Pain in left foot: Secondary | ICD-10-CM | POA: Diagnosis not present

## 2021-10-14 DIAGNOSIS — L600 Ingrowing nail: Secondary | ICD-10-CM | POA: Diagnosis not present

## 2021-10-15 DIAGNOSIS — I10 Essential (primary) hypertension: Secondary | ICD-10-CM | POA: Diagnosis not present

## 2021-10-20 DIAGNOSIS — M5136 Other intervertebral disc degeneration, lumbar region: Secondary | ICD-10-CM | POA: Diagnosis not present

## 2021-10-20 DIAGNOSIS — I4891 Unspecified atrial fibrillation: Secondary | ICD-10-CM | POA: Diagnosis not present

## 2021-10-20 DIAGNOSIS — K219 Gastro-esophageal reflux disease without esophagitis: Secondary | ICD-10-CM | POA: Diagnosis not present

## 2021-10-20 DIAGNOSIS — I10 Essential (primary) hypertension: Secondary | ICD-10-CM | POA: Diagnosis not present

## 2021-10-20 DIAGNOSIS — G309 Alzheimer's disease, unspecified: Secondary | ICD-10-CM | POA: Diagnosis not present

## 2021-10-20 DIAGNOSIS — E039 Hypothyroidism, unspecified: Secondary | ICD-10-CM | POA: Diagnosis not present

## 2021-10-21 DIAGNOSIS — G309 Alzheimer's disease, unspecified: Secondary | ICD-10-CM | POA: Diagnosis not present

## 2021-10-21 DIAGNOSIS — E039 Hypothyroidism, unspecified: Secondary | ICD-10-CM | POA: Diagnosis not present

## 2021-10-21 DIAGNOSIS — D518 Other vitamin B12 deficiency anemias: Secondary | ICD-10-CM | POA: Diagnosis not present

## 2021-10-21 DIAGNOSIS — M170 Bilateral primary osteoarthritis of knee: Secondary | ICD-10-CM | POA: Diagnosis not present

## 2021-10-21 DIAGNOSIS — E559 Vitamin D deficiency, unspecified: Secondary | ICD-10-CM | POA: Diagnosis not present

## 2021-10-21 DIAGNOSIS — I10 Essential (primary) hypertension: Secondary | ICD-10-CM | POA: Diagnosis not present

## 2021-10-21 DIAGNOSIS — E038 Other specified hypothyroidism: Secondary | ICD-10-CM | POA: Diagnosis not present

## 2021-10-21 DIAGNOSIS — I4891 Unspecified atrial fibrillation: Secondary | ICD-10-CM | POA: Diagnosis not present

## 2021-10-21 DIAGNOSIS — E119 Type 2 diabetes mellitus without complications: Secondary | ICD-10-CM | POA: Diagnosis not present

## 2021-11-11 DIAGNOSIS — E7849 Other hyperlipidemia: Secondary | ICD-10-CM | POA: Diagnosis not present

## 2021-11-11 DIAGNOSIS — E038 Other specified hypothyroidism: Secondary | ICD-10-CM | POA: Diagnosis not present

## 2021-11-11 DIAGNOSIS — Z79899 Other long term (current) drug therapy: Secondary | ICD-10-CM | POA: Diagnosis not present

## 2021-11-11 DIAGNOSIS — D518 Other vitamin B12 deficiency anemias: Secondary | ICD-10-CM | POA: Diagnosis not present

## 2021-11-11 DIAGNOSIS — E119 Type 2 diabetes mellitus without complications: Secondary | ICD-10-CM | POA: Diagnosis not present

## 2021-11-11 DIAGNOSIS — E559 Vitamin D deficiency, unspecified: Secondary | ICD-10-CM | POA: Diagnosis not present

## 2021-11-14 DIAGNOSIS — G309 Alzheimer's disease, unspecified: Secondary | ICD-10-CM | POA: Diagnosis not present

## 2021-11-14 DIAGNOSIS — I4891 Unspecified atrial fibrillation: Secondary | ICD-10-CM | POA: Diagnosis not present

## 2021-11-14 DIAGNOSIS — Z0000 Encounter for general adult medical examination without abnormal findings: Secondary | ICD-10-CM | POA: Diagnosis not present

## 2021-11-14 DIAGNOSIS — M5136 Other intervertebral disc degeneration, lumbar region: Secondary | ICD-10-CM | POA: Diagnosis not present

## 2021-11-14 DIAGNOSIS — I10 Essential (primary) hypertension: Secondary | ICD-10-CM | POA: Diagnosis not present

## 2021-11-14 DIAGNOSIS — K219 Gastro-esophageal reflux disease without esophagitis: Secondary | ICD-10-CM | POA: Diagnosis not present

## 2021-11-15 DIAGNOSIS — I10 Essential (primary) hypertension: Secondary | ICD-10-CM | POA: Diagnosis not present

## 2021-11-17 DIAGNOSIS — K219 Gastro-esophageal reflux disease without esophagitis: Secondary | ICD-10-CM | POA: Diagnosis not present

## 2021-11-17 DIAGNOSIS — N390 Urinary tract infection, site not specified: Secondary | ICD-10-CM | POA: Diagnosis not present

## 2021-11-17 DIAGNOSIS — E039 Hypothyroidism, unspecified: Secondary | ICD-10-CM | POA: Diagnosis not present

## 2021-11-17 DIAGNOSIS — I4891 Unspecified atrial fibrillation: Secondary | ICD-10-CM | POA: Diagnosis not present

## 2021-11-17 DIAGNOSIS — G309 Alzheimer's disease, unspecified: Secondary | ICD-10-CM | POA: Diagnosis not present

## 2021-11-17 DIAGNOSIS — I10 Essential (primary) hypertension: Secondary | ICD-10-CM | POA: Diagnosis not present

## 2021-11-28 DIAGNOSIS — E119 Type 2 diabetes mellitus without complications: Secondary | ICD-10-CM | POA: Diagnosis not present

## 2021-11-28 DIAGNOSIS — M170 Bilateral primary osteoarthritis of knee: Secondary | ICD-10-CM | POA: Diagnosis not present

## 2021-11-28 DIAGNOSIS — D518 Other vitamin B12 deficiency anemias: Secondary | ICD-10-CM | POA: Diagnosis not present

## 2021-11-28 DIAGNOSIS — I10 Essential (primary) hypertension: Secondary | ICD-10-CM | POA: Diagnosis not present

## 2021-11-28 DIAGNOSIS — E559 Vitamin D deficiency, unspecified: Secondary | ICD-10-CM | POA: Diagnosis not present

## 2021-11-28 DIAGNOSIS — E038 Other specified hypothyroidism: Secondary | ICD-10-CM | POA: Diagnosis not present

## 2021-11-28 DIAGNOSIS — G309 Alzheimer's disease, unspecified: Secondary | ICD-10-CM | POA: Diagnosis not present

## 2021-12-15 DIAGNOSIS — M5136 Other intervertebral disc degeneration, lumbar region: Secondary | ICD-10-CM | POA: Diagnosis not present

## 2021-12-15 DIAGNOSIS — I4891 Unspecified atrial fibrillation: Secondary | ICD-10-CM | POA: Diagnosis not present

## 2021-12-15 DIAGNOSIS — I10 Essential (primary) hypertension: Secondary | ICD-10-CM | POA: Diagnosis not present

## 2021-12-15 DIAGNOSIS — G309 Alzheimer's disease, unspecified: Secondary | ICD-10-CM | POA: Diagnosis not present

## 2021-12-15 DIAGNOSIS — K219 Gastro-esophageal reflux disease without esophagitis: Secondary | ICD-10-CM | POA: Diagnosis not present

## 2021-12-16 DIAGNOSIS — I10 Essential (primary) hypertension: Secondary | ICD-10-CM | POA: Diagnosis not present

## 2021-12-29 DIAGNOSIS — I10 Essential (primary) hypertension: Secondary | ICD-10-CM | POA: Diagnosis not present

## 2021-12-29 DIAGNOSIS — D518 Other vitamin B12 deficiency anemias: Secondary | ICD-10-CM | POA: Diagnosis not present

## 2021-12-29 DIAGNOSIS — G309 Alzheimer's disease, unspecified: Secondary | ICD-10-CM | POA: Diagnosis not present

## 2021-12-29 DIAGNOSIS — E119 Type 2 diabetes mellitus without complications: Secondary | ICD-10-CM | POA: Diagnosis not present

## 2021-12-29 DIAGNOSIS — E559 Vitamin D deficiency, unspecified: Secondary | ICD-10-CM | POA: Diagnosis not present

## 2021-12-29 DIAGNOSIS — E038 Other specified hypothyroidism: Secondary | ICD-10-CM | POA: Diagnosis not present

## 2021-12-29 DIAGNOSIS — M170 Bilateral primary osteoarthritis of knee: Secondary | ICD-10-CM | POA: Diagnosis not present

## 2022-01-13 DIAGNOSIS — Z79899 Other long term (current) drug therapy: Secondary | ICD-10-CM | POA: Diagnosis not present

## 2022-01-13 DIAGNOSIS — E038 Other specified hypothyroidism: Secondary | ICD-10-CM | POA: Diagnosis not present

## 2022-01-15 DIAGNOSIS — I10 Essential (primary) hypertension: Secondary | ICD-10-CM | POA: Diagnosis not present

## 2022-01-19 DIAGNOSIS — I4891 Unspecified atrial fibrillation: Secondary | ICD-10-CM | POA: Diagnosis not present

## 2022-01-19 DIAGNOSIS — E039 Hypothyroidism, unspecified: Secondary | ICD-10-CM | POA: Diagnosis not present

## 2022-01-19 DIAGNOSIS — K219 Gastro-esophageal reflux disease without esophagitis: Secondary | ICD-10-CM | POA: Diagnosis not present

## 2022-01-19 DIAGNOSIS — I10 Essential (primary) hypertension: Secondary | ICD-10-CM | POA: Diagnosis not present

## 2022-01-19 DIAGNOSIS — M5136 Other intervertebral disc degeneration, lumbar region: Secondary | ICD-10-CM | POA: Diagnosis not present

## 2022-01-19 DIAGNOSIS — G309 Alzheimer's disease, unspecified: Secondary | ICD-10-CM | POA: Diagnosis not present

## 2022-01-26 DIAGNOSIS — M170 Bilateral primary osteoarthritis of knee: Secondary | ICD-10-CM | POA: Diagnosis not present

## 2022-01-26 DIAGNOSIS — I10 Essential (primary) hypertension: Secondary | ICD-10-CM | POA: Diagnosis not present

## 2022-01-26 DIAGNOSIS — E559 Vitamin D deficiency, unspecified: Secondary | ICD-10-CM | POA: Diagnosis not present

## 2022-01-26 DIAGNOSIS — D518 Other vitamin B12 deficiency anemias: Secondary | ICD-10-CM | POA: Diagnosis not present

## 2022-01-26 DIAGNOSIS — E038 Other specified hypothyroidism: Secondary | ICD-10-CM | POA: Diagnosis not present

## 2022-01-26 DIAGNOSIS — E119 Type 2 diabetes mellitus without complications: Secondary | ICD-10-CM | POA: Diagnosis not present

## 2022-02-15 DIAGNOSIS — I10 Essential (primary) hypertension: Secondary | ICD-10-CM | POA: Diagnosis not present

## 2022-02-21 DIAGNOSIS — E038 Other specified hypothyroidism: Secondary | ICD-10-CM | POA: Diagnosis not present

## 2022-02-21 DIAGNOSIS — M170 Bilateral primary osteoarthritis of knee: Secondary | ICD-10-CM | POA: Diagnosis not present

## 2022-02-21 DIAGNOSIS — G309 Alzheimer's disease, unspecified: Secondary | ICD-10-CM | POA: Diagnosis not present

## 2022-02-21 DIAGNOSIS — I10 Essential (primary) hypertension: Secondary | ICD-10-CM | POA: Diagnosis not present

## 2022-02-21 DIAGNOSIS — E119 Type 2 diabetes mellitus without complications: Secondary | ICD-10-CM | POA: Diagnosis not present

## 2022-02-21 DIAGNOSIS — E782 Mixed hyperlipidemia: Secondary | ICD-10-CM | POA: Diagnosis not present

## 2022-02-21 DIAGNOSIS — D518 Other vitamin B12 deficiency anemias: Secondary | ICD-10-CM | POA: Diagnosis not present

## 2022-02-21 DIAGNOSIS — E559 Vitamin D deficiency, unspecified: Secondary | ICD-10-CM | POA: Diagnosis not present

## 2022-03-02 DIAGNOSIS — K219 Gastro-esophageal reflux disease without esophagitis: Secondary | ICD-10-CM | POA: Diagnosis not present

## 2022-03-02 DIAGNOSIS — E039 Hypothyroidism, unspecified: Secondary | ICD-10-CM | POA: Diagnosis not present

## 2022-03-02 DIAGNOSIS — I10 Essential (primary) hypertension: Secondary | ICD-10-CM | POA: Diagnosis not present

## 2022-03-02 DIAGNOSIS — I4891 Unspecified atrial fibrillation: Secondary | ICD-10-CM | POA: Diagnosis not present

## 2022-03-02 DIAGNOSIS — G309 Alzheimer's disease, unspecified: Secondary | ICD-10-CM | POA: Diagnosis not present

## 2022-03-03 DIAGNOSIS — E038 Other specified hypothyroidism: Secondary | ICD-10-CM | POA: Diagnosis not present

## 2022-03-03 DIAGNOSIS — D518 Other vitamin B12 deficiency anemias: Secondary | ICD-10-CM | POA: Diagnosis not present

## 2022-03-03 DIAGNOSIS — E559 Vitamin D deficiency, unspecified: Secondary | ICD-10-CM | POA: Diagnosis not present

## 2022-03-03 DIAGNOSIS — Z79899 Other long term (current) drug therapy: Secondary | ICD-10-CM | POA: Diagnosis not present

## 2022-03-03 DIAGNOSIS — E782 Mixed hyperlipidemia: Secondary | ICD-10-CM | POA: Diagnosis not present

## 2022-03-03 DIAGNOSIS — E119 Type 2 diabetes mellitus without complications: Secondary | ICD-10-CM | POA: Diagnosis not present

## 2022-03-06 DIAGNOSIS — Z743 Need for continuous supervision: Secondary | ICD-10-CM | POA: Diagnosis not present

## 2022-03-06 DIAGNOSIS — R6889 Other general symptoms and signs: Secondary | ICD-10-CM | POA: Diagnosis not present

## 2022-03-06 DIAGNOSIS — Z043 Encounter for examination and observation following other accident: Secondary | ICD-10-CM | POA: Diagnosis not present

## 2022-03-06 DIAGNOSIS — M4312 Spondylolisthesis, cervical region: Secondary | ICD-10-CM | POA: Diagnosis not present

## 2022-03-06 DIAGNOSIS — S62525B Nondisplaced fracture of distal phalanx of left thumb, initial encounter for open fracture: Secondary | ICD-10-CM | POA: Diagnosis not present

## 2022-03-06 DIAGNOSIS — M47812 Spondylosis without myelopathy or radiculopathy, cervical region: Secondary | ICD-10-CM | POA: Diagnosis not present

## 2022-03-06 DIAGNOSIS — M40202 Unspecified kyphosis, cervical region: Secondary | ICD-10-CM | POA: Diagnosis not present

## 2022-03-06 DIAGNOSIS — I517 Cardiomegaly: Secondary | ICD-10-CM | POA: Diagnosis not present

## 2022-03-06 DIAGNOSIS — M7989 Other specified soft tissue disorders: Secondary | ICD-10-CM | POA: Diagnosis not present

## 2022-03-06 DIAGNOSIS — T148XXA Other injury of unspecified body region, initial encounter: Secondary | ICD-10-CM | POA: Diagnosis not present

## 2022-03-06 DIAGNOSIS — I700 Atherosclerosis of aorta: Secondary | ICD-10-CM | POA: Diagnosis not present

## 2022-03-06 DIAGNOSIS — G9389 Other specified disorders of brain: Secondary | ICD-10-CM | POA: Diagnosis not present

## 2022-03-06 DIAGNOSIS — S0990XA Unspecified injury of head, initial encounter: Secondary | ICD-10-CM | POA: Diagnosis not present

## 2022-03-06 DIAGNOSIS — W19XXXA Unspecified fall, initial encounter: Secondary | ICD-10-CM | POA: Diagnosis not present

## 2022-03-06 DIAGNOSIS — S199XXA Unspecified injury of neck, initial encounter: Secondary | ICD-10-CM | POA: Diagnosis not present

## 2022-03-06 DIAGNOSIS — M19011 Primary osteoarthritis, right shoulder: Secondary | ICD-10-CM | POA: Diagnosis not present

## 2022-03-06 DIAGNOSIS — S62502A Fracture of unspecified phalanx of left thumb, initial encounter for closed fracture: Secondary | ICD-10-CM | POA: Diagnosis not present

## 2022-03-06 DIAGNOSIS — R58 Hemorrhage, not elsewhere classified: Secondary | ICD-10-CM | POA: Diagnosis not present

## 2022-03-06 DIAGNOSIS — R0902 Hypoxemia: Secondary | ICD-10-CM | POA: Diagnosis not present

## 2022-03-09 DIAGNOSIS — S62501D Fracture of unspecified phalanx of right thumb, subsequent encounter for fracture with routine healing: Secondary | ICD-10-CM | POA: Diagnosis not present

## 2022-03-09 DIAGNOSIS — R296 Repeated falls: Secondary | ICD-10-CM | POA: Diagnosis not present

## 2022-03-16 DIAGNOSIS — S62522A Displaced fracture of distal phalanx of left thumb, initial encounter for closed fracture: Secondary | ICD-10-CM | POA: Diagnosis not present

## 2022-03-31 DIAGNOSIS — Z79899 Other long term (current) drug therapy: Secondary | ICD-10-CM | POA: Diagnosis not present

## 2022-03-31 DIAGNOSIS — R059 Cough, unspecified: Secondary | ICD-10-CM | POA: Diagnosis not present

## 2022-03-31 DIAGNOSIS — Z66 Do not resuscitate: Secondary | ICD-10-CM | POA: Diagnosis not present

## 2022-03-31 DIAGNOSIS — G9341 Metabolic encephalopathy: Secondary | ICD-10-CM | POA: Diagnosis not present

## 2022-03-31 DIAGNOSIS — J9601 Acute respiratory failure with hypoxia: Secondary | ICD-10-CM | POA: Diagnosis not present

## 2022-03-31 DIAGNOSIS — I10 Essential (primary) hypertension: Secondary | ICD-10-CM | POA: Diagnosis not present

## 2022-03-31 DIAGNOSIS — R062 Wheezing: Secondary | ICD-10-CM | POA: Diagnosis not present

## 2022-03-31 DIAGNOSIS — I129 Hypertensive chronic kidney disease with stage 1 through stage 4 chronic kidney disease, or unspecified chronic kidney disease: Secondary | ICD-10-CM | POA: Diagnosis not present

## 2022-03-31 DIAGNOSIS — Z743 Need for continuous supervision: Secondary | ICD-10-CM | POA: Diagnosis not present

## 2022-03-31 DIAGNOSIS — J9690 Respiratory failure, unspecified, unspecified whether with hypoxia or hypercapnia: Secondary | ICD-10-CM | POA: Diagnosis not present

## 2022-03-31 DIAGNOSIS — Z23 Encounter for immunization: Secondary | ICD-10-CM | POA: Diagnosis not present

## 2022-03-31 DIAGNOSIS — J45909 Unspecified asthma, uncomplicated: Secondary | ICD-10-CM | POA: Diagnosis not present

## 2022-03-31 DIAGNOSIS — R6889 Other general symptoms and signs: Secondary | ICD-10-CM | POA: Diagnosis not present

## 2022-03-31 DIAGNOSIS — Z888 Allergy status to other drugs, medicaments and biological substances status: Secondary | ICD-10-CM | POA: Diagnosis not present

## 2022-03-31 DIAGNOSIS — M1990 Unspecified osteoarthritis, unspecified site: Secondary | ICD-10-CM | POA: Diagnosis not present

## 2022-03-31 DIAGNOSIS — Z7401 Bed confinement status: Secondary | ICD-10-CM | POA: Diagnosis not present

## 2022-03-31 DIAGNOSIS — E039 Hypothyroidism, unspecified: Secondary | ICD-10-CM | POA: Diagnosis not present

## 2022-03-31 DIAGNOSIS — J9811 Atelectasis: Secondary | ICD-10-CM | POA: Diagnosis not present

## 2022-03-31 DIAGNOSIS — J690 Pneumonitis due to inhalation of food and vomit: Secondary | ICD-10-CM | POA: Diagnosis not present

## 2022-03-31 DIAGNOSIS — K219 Gastro-esophageal reflux disease without esophagitis: Secondary | ICD-10-CM | POA: Diagnosis not present

## 2022-03-31 DIAGNOSIS — R0602 Shortness of breath: Secondary | ICD-10-CM | POA: Diagnosis not present

## 2022-03-31 DIAGNOSIS — I491 Atrial premature depolarization: Secondary | ICD-10-CM | POA: Diagnosis not present

## 2022-03-31 DIAGNOSIS — Z1152 Encounter for screening for COVID-19: Secondary | ICD-10-CM | POA: Diagnosis not present

## 2022-03-31 DIAGNOSIS — Z7901 Long term (current) use of anticoagulants: Secondary | ICD-10-CM | POA: Diagnosis not present

## 2022-03-31 DIAGNOSIS — N189 Chronic kidney disease, unspecified: Secondary | ICD-10-CM | POA: Diagnosis not present

## 2022-03-31 DIAGNOSIS — I4891 Unspecified atrial fibrillation: Secondary | ICD-10-CM | POA: Diagnosis not present

## 2022-03-31 DIAGNOSIS — R0902 Hypoxemia: Secondary | ICD-10-CM | POA: Diagnosis not present

## 2022-03-31 DIAGNOSIS — R531 Weakness: Secondary | ICD-10-CM | POA: Diagnosis not present

## 2022-04-01 DIAGNOSIS — I4891 Unspecified atrial fibrillation: Secondary | ICD-10-CM | POA: Diagnosis not present

## 2022-04-06 DIAGNOSIS — E039 Hypothyroidism, unspecified: Secondary | ICD-10-CM | POA: Diagnosis not present

## 2022-04-06 DIAGNOSIS — I4891 Unspecified atrial fibrillation: Secondary | ICD-10-CM | POA: Diagnosis not present

## 2022-04-06 DIAGNOSIS — G309 Alzheimer's disease, unspecified: Secondary | ICD-10-CM | POA: Diagnosis not present

## 2022-04-06 DIAGNOSIS — K219 Gastro-esophageal reflux disease without esophagitis: Secondary | ICD-10-CM | POA: Diagnosis not present

## 2022-04-06 DIAGNOSIS — I10 Essential (primary) hypertension: Secondary | ICD-10-CM | POA: Diagnosis not present

## 2022-04-15 DIAGNOSIS — S62522A Displaced fracture of distal phalanx of left thumb, initial encounter for closed fracture: Secondary | ICD-10-CM | POA: Diagnosis not present

## 2022-04-16 DIAGNOSIS — B351 Tinea unguium: Secondary | ICD-10-CM | POA: Diagnosis not present

## 2022-04-16 DIAGNOSIS — D518 Other vitamin B12 deficiency anemias: Secondary | ICD-10-CM | POA: Diagnosis not present

## 2022-04-16 DIAGNOSIS — E782 Mixed hyperlipidemia: Secondary | ICD-10-CM | POA: Diagnosis not present

## 2022-04-16 DIAGNOSIS — I10 Essential (primary) hypertension: Secondary | ICD-10-CM | POA: Diagnosis not present

## 2022-04-16 DIAGNOSIS — M79672 Pain in left foot: Secondary | ICD-10-CM | POA: Diagnosis not present

## 2022-04-16 DIAGNOSIS — E119 Type 2 diabetes mellitus without complications: Secondary | ICD-10-CM | POA: Diagnosis not present

## 2022-04-16 DIAGNOSIS — E038 Other specified hypothyroidism: Secondary | ICD-10-CM | POA: Diagnosis not present

## 2022-04-16 DIAGNOSIS — M170 Bilateral primary osteoarthritis of knee: Secondary | ICD-10-CM | POA: Diagnosis not present

## 2022-04-16 DIAGNOSIS — L600 Ingrowing nail: Secondary | ICD-10-CM | POA: Diagnosis not present

## 2022-04-16 DIAGNOSIS — E559 Vitamin D deficiency, unspecified: Secondary | ICD-10-CM | POA: Diagnosis not present

## 2022-04-16 DIAGNOSIS — M79671 Pain in right foot: Secondary | ICD-10-CM | POA: Diagnosis not present

## 2022-04-16 DIAGNOSIS — G309 Alzheimer's disease, unspecified: Secondary | ICD-10-CM | POA: Diagnosis not present

## 2022-04-17 DIAGNOSIS — I10 Essential (primary) hypertension: Secondary | ICD-10-CM | POA: Diagnosis not present

## 2022-04-24 DIAGNOSIS — E559 Vitamin D deficiency, unspecified: Secondary | ICD-10-CM | POA: Diagnosis not present

## 2022-04-24 DIAGNOSIS — D518 Other vitamin B12 deficiency anemias: Secondary | ICD-10-CM | POA: Diagnosis not present

## 2022-04-24 DIAGNOSIS — E038 Other specified hypothyroidism: Secondary | ICD-10-CM | POA: Diagnosis not present

## 2022-04-24 DIAGNOSIS — E119 Type 2 diabetes mellitus without complications: Secondary | ICD-10-CM | POA: Diagnosis not present

## 2022-04-24 DIAGNOSIS — I10 Essential (primary) hypertension: Secondary | ICD-10-CM | POA: Diagnosis not present

## 2022-04-24 DIAGNOSIS — G309 Alzheimer's disease, unspecified: Secondary | ICD-10-CM | POA: Diagnosis not present

## 2022-04-24 DIAGNOSIS — E782 Mixed hyperlipidemia: Secondary | ICD-10-CM | POA: Diagnosis not present

## 2022-05-11 DIAGNOSIS — K219 Gastro-esophageal reflux disease without esophagitis: Secondary | ICD-10-CM | POA: Diagnosis not present

## 2022-05-11 DIAGNOSIS — I4891 Unspecified atrial fibrillation: Secondary | ICD-10-CM | POA: Diagnosis not present

## 2022-05-11 DIAGNOSIS — M5136 Other intervertebral disc degeneration, lumbar region: Secondary | ICD-10-CM | POA: Diagnosis not present

## 2022-05-11 DIAGNOSIS — I10 Essential (primary) hypertension: Secondary | ICD-10-CM | POA: Diagnosis not present

## 2022-05-18 DIAGNOSIS — I10 Essential (primary) hypertension: Secondary | ICD-10-CM | POA: Diagnosis not present

## 2022-06-08 DIAGNOSIS — K219 Gastro-esophageal reflux disease without esophagitis: Secondary | ICD-10-CM | POA: Diagnosis not present

## 2022-06-08 DIAGNOSIS — E039 Hypothyroidism, unspecified: Secondary | ICD-10-CM | POA: Diagnosis not present

## 2022-06-08 DIAGNOSIS — I4891 Unspecified atrial fibrillation: Secondary | ICD-10-CM | POA: Diagnosis not present

## 2022-06-08 DIAGNOSIS — I10 Essential (primary) hypertension: Secondary | ICD-10-CM | POA: Diagnosis not present

## 2022-06-08 DIAGNOSIS — G309 Alzheimer's disease, unspecified: Secondary | ICD-10-CM | POA: Diagnosis not present

## 2022-06-09 DIAGNOSIS — Z79899 Other long term (current) drug therapy: Secondary | ICD-10-CM | POA: Diagnosis not present

## 2022-06-09 DIAGNOSIS — D518 Other vitamin B12 deficiency anemias: Secondary | ICD-10-CM | POA: Diagnosis not present

## 2022-06-09 DIAGNOSIS — E119 Type 2 diabetes mellitus without complications: Secondary | ICD-10-CM | POA: Diagnosis not present

## 2022-06-09 DIAGNOSIS — E782 Mixed hyperlipidemia: Secondary | ICD-10-CM | POA: Diagnosis not present

## 2022-06-09 DIAGNOSIS — E038 Other specified hypothyroidism: Secondary | ICD-10-CM | POA: Diagnosis not present

## 2022-06-15 DIAGNOSIS — G309 Alzheimer's disease, unspecified: Secondary | ICD-10-CM | POA: Diagnosis not present

## 2022-06-15 DIAGNOSIS — I10 Essential (primary) hypertension: Secondary | ICD-10-CM | POA: Diagnosis not present

## 2022-06-15 DIAGNOSIS — E038 Other specified hypothyroidism: Secondary | ICD-10-CM | POA: Diagnosis not present

## 2022-06-15 DIAGNOSIS — M170 Bilateral primary osteoarthritis of knee: Secondary | ICD-10-CM | POA: Diagnosis not present

## 2022-06-15 DIAGNOSIS — E119 Type 2 diabetes mellitus without complications: Secondary | ICD-10-CM | POA: Diagnosis not present

## 2022-06-15 DIAGNOSIS — D518 Other vitamin B12 deficiency anemias: Secondary | ICD-10-CM | POA: Diagnosis not present

## 2022-06-15 DIAGNOSIS — E782 Mixed hyperlipidemia: Secondary | ICD-10-CM | POA: Diagnosis not present

## 2022-06-15 DIAGNOSIS — E559 Vitamin D deficiency, unspecified: Secondary | ICD-10-CM | POA: Diagnosis not present

## 2022-06-17 DIAGNOSIS — I10 Essential (primary) hypertension: Secondary | ICD-10-CM | POA: Diagnosis not present

## 2022-06-22 DIAGNOSIS — B372 Candidiasis of skin and nail: Secondary | ICD-10-CM | POA: Diagnosis not present

## 2022-06-22 DIAGNOSIS — J309 Allergic rhinitis, unspecified: Secondary | ICD-10-CM | POA: Diagnosis not present

## 2022-06-28 DIAGNOSIS — J9811 Atelectasis: Secondary | ICD-10-CM | POA: Diagnosis not present

## 2022-06-28 DIAGNOSIS — Z7401 Bed confinement status: Secondary | ICD-10-CM | POA: Diagnosis not present

## 2022-06-28 DIAGNOSIS — K922 Gastrointestinal hemorrhage, unspecified: Secondary | ICD-10-CM | POA: Diagnosis not present

## 2022-06-28 DIAGNOSIS — K5730 Diverticulosis of large intestine without perforation or abscess without bleeding: Secondary | ICD-10-CM | POA: Diagnosis not present

## 2022-06-28 DIAGNOSIS — I10 Essential (primary) hypertension: Secondary | ICD-10-CM | POA: Diagnosis not present

## 2022-06-28 DIAGNOSIS — I444 Left anterior fascicular block: Secondary | ICD-10-CM | POA: Diagnosis not present

## 2022-06-28 DIAGNOSIS — R58 Hemorrhage, not elsewhere classified: Secondary | ICD-10-CM | POA: Diagnosis not present

## 2022-06-28 DIAGNOSIS — R112 Nausea with vomiting, unspecified: Secondary | ICD-10-CM | POA: Diagnosis not present

## 2022-06-28 DIAGNOSIS — R109 Unspecified abdominal pain: Secondary | ICD-10-CM | POA: Diagnosis not present

## 2022-06-28 DIAGNOSIS — K219 Gastro-esophageal reflux disease without esophagitis: Secondary | ICD-10-CM | POA: Diagnosis not present

## 2022-06-28 DIAGNOSIS — R1110 Vomiting, unspecified: Secondary | ICD-10-CM | POA: Diagnosis not present

## 2022-06-28 DIAGNOSIS — K920 Hematemesis: Secondary | ICD-10-CM | POA: Diagnosis not present

## 2022-06-28 DIAGNOSIS — Z743 Need for continuous supervision: Secondary | ICD-10-CM | POA: Diagnosis not present

## 2022-06-28 DIAGNOSIS — I700 Atherosclerosis of aorta: Secondary | ICD-10-CM | POA: Diagnosis not present

## 2022-06-28 DIAGNOSIS — R9431 Abnormal electrocardiogram [ECG] [EKG]: Secondary | ICD-10-CM | POA: Diagnosis not present

## 2022-06-29 DIAGNOSIS — R112 Nausea with vomiting, unspecified: Secondary | ICD-10-CM | POA: Diagnosis not present

## 2022-07-02 DIAGNOSIS — E038 Other specified hypothyroidism: Secondary | ICD-10-CM | POA: Diagnosis not present

## 2022-07-02 DIAGNOSIS — E119 Type 2 diabetes mellitus without complications: Secondary | ICD-10-CM | POA: Diagnosis not present

## 2022-07-02 DIAGNOSIS — M170 Bilateral primary osteoarthritis of knee: Secondary | ICD-10-CM | POA: Diagnosis not present

## 2022-07-02 DIAGNOSIS — E782 Mixed hyperlipidemia: Secondary | ICD-10-CM | POA: Diagnosis not present

## 2022-07-02 DIAGNOSIS — I10 Essential (primary) hypertension: Secondary | ICD-10-CM | POA: Diagnosis not present

## 2022-07-02 DIAGNOSIS — E559 Vitamin D deficiency, unspecified: Secondary | ICD-10-CM | POA: Diagnosis not present

## 2022-07-02 DIAGNOSIS — E039 Hypothyroidism, unspecified: Secondary | ICD-10-CM | POA: Diagnosis not present

## 2022-07-02 DIAGNOSIS — G309 Alzheimer's disease, unspecified: Secondary | ICD-10-CM | POA: Diagnosis not present

## 2022-07-02 DIAGNOSIS — D518 Other vitamin B12 deficiency anemias: Secondary | ICD-10-CM | POA: Diagnosis not present

## 2022-07-02 DIAGNOSIS — I4891 Unspecified atrial fibrillation: Secondary | ICD-10-CM | POA: Diagnosis not present

## 2022-07-06 DIAGNOSIS — R011 Cardiac murmur, unspecified: Secondary | ICD-10-CM | POA: Diagnosis not present

## 2022-07-07 DIAGNOSIS — I6523 Occlusion and stenosis of bilateral carotid arteries: Secondary | ICD-10-CM | POA: Diagnosis not present

## 2022-07-07 DIAGNOSIS — R0989 Other specified symptoms and signs involving the circulatory and respiratory systems: Secondary | ICD-10-CM | POA: Diagnosis not present

## 2022-07-08 DIAGNOSIS — I77811 Abdominal aortic ectasia: Secondary | ICD-10-CM | POA: Diagnosis not present

## 2022-07-15 DIAGNOSIS — I70223 Atherosclerosis of native arteries of extremities with rest pain, bilateral legs: Secondary | ICD-10-CM | POA: Diagnosis not present

## 2022-07-18 DIAGNOSIS — I10 Essential (primary) hypertension: Secondary | ICD-10-CM | POA: Diagnosis not present

## 2022-07-27 DIAGNOSIS — I10 Essential (primary) hypertension: Secondary | ICD-10-CM | POA: Diagnosis not present

## 2022-07-27 DIAGNOSIS — M5136 Other intervertebral disc degeneration, lumbar region: Secondary | ICD-10-CM | POA: Diagnosis not present

## 2022-07-27 DIAGNOSIS — E039 Hypothyroidism, unspecified: Secondary | ICD-10-CM | POA: Diagnosis not present

## 2022-07-27 DIAGNOSIS — G309 Alzheimer's disease, unspecified: Secondary | ICD-10-CM | POA: Diagnosis not present

## 2022-07-27 DIAGNOSIS — I482 Chronic atrial fibrillation, unspecified: Secondary | ICD-10-CM | POA: Diagnosis not present

## 2022-07-27 DIAGNOSIS — K219 Gastro-esophageal reflux disease without esophagitis: Secondary | ICD-10-CM | POA: Diagnosis not present

## 2022-07-28 DIAGNOSIS — L600 Ingrowing nail: Secondary | ICD-10-CM | POA: Diagnosis not present

## 2022-07-28 DIAGNOSIS — M170 Bilateral primary osteoarthritis of knee: Secondary | ICD-10-CM | POA: Diagnosis not present

## 2022-07-28 DIAGNOSIS — M79672 Pain in left foot: Secondary | ICD-10-CM | POA: Diagnosis not present

## 2022-07-28 DIAGNOSIS — E559 Vitamin D deficiency, unspecified: Secondary | ICD-10-CM | POA: Diagnosis not present

## 2022-07-28 DIAGNOSIS — G309 Alzheimer's disease, unspecified: Secondary | ICD-10-CM | POA: Diagnosis not present

## 2022-07-28 DIAGNOSIS — M79671 Pain in right foot: Secondary | ICD-10-CM | POA: Diagnosis not present

## 2022-07-28 DIAGNOSIS — D518 Other vitamin B12 deficiency anemias: Secondary | ICD-10-CM | POA: Diagnosis not present

## 2022-07-28 DIAGNOSIS — B351 Tinea unguium: Secondary | ICD-10-CM | POA: Diagnosis not present

## 2022-07-28 DIAGNOSIS — E119 Type 2 diabetes mellitus without complications: Secondary | ICD-10-CM | POA: Diagnosis not present

## 2022-07-28 DIAGNOSIS — E038 Other specified hypothyroidism: Secondary | ICD-10-CM | POA: Diagnosis not present

## 2022-07-28 DIAGNOSIS — E782 Mixed hyperlipidemia: Secondary | ICD-10-CM | POA: Diagnosis not present

## 2022-07-28 DIAGNOSIS — I4891 Unspecified atrial fibrillation: Secondary | ICD-10-CM | POA: Diagnosis not present

## 2022-07-28 DIAGNOSIS — I10 Essential (primary) hypertension: Secondary | ICD-10-CM | POA: Diagnosis not present

## 2022-08-09 DIAGNOSIS — R3915 Urgency of urination: Secondary | ICD-10-CM | POA: Diagnosis not present

## 2022-08-18 DIAGNOSIS — I10 Essential (primary) hypertension: Secondary | ICD-10-CM | POA: Diagnosis not present
